# Patient Record
Sex: Male | Born: 1965 | Race: White | Hispanic: No | Smoking: Current every day smoker
Health system: Southern US, Community
[De-identification: ages and names within clinical notes are randomized; demographics above are authoritative.]

## PROBLEM LIST (undated history)

## (undated) DIAGNOSIS — M549 Dorsalgia, unspecified: Secondary | ICD-10-CM

## (undated) DIAGNOSIS — G8929 Other chronic pain: Secondary | ICD-10-CM

---

## 1997-09-19 ENCOUNTER — Ambulatory Visit (HOSPITAL_COMMUNITY): Admission: RE | Admit: 1997-09-19 | Discharge: 1997-09-19 | Payer: Self-pay | Admitting: Family Medicine

## 1999-03-05 ENCOUNTER — Encounter: Admission: RE | Admit: 1999-03-05 | Discharge: 1999-03-21 | Payer: Self-pay | Admitting: *Deleted

## 2005-08-20 ENCOUNTER — Emergency Department (HOSPITAL_COMMUNITY): Admission: EM | Admit: 2005-08-20 | Discharge: 2005-08-20 | Payer: Self-pay | Admitting: Emergency Medicine

## 2007-03-09 ENCOUNTER — Emergency Department (HOSPITAL_COMMUNITY): Admission: EM | Admit: 2007-03-09 | Discharge: 2007-03-09 | Payer: Self-pay | Admitting: Emergency Medicine

## 2007-10-13 ENCOUNTER — Encounter: Admission: RE | Admit: 2007-10-13 | Discharge: 2007-11-04 | Payer: Self-pay | Admitting: Family Medicine

## 2010-12-27 LAB — DIFFERENTIAL
Basophils Absolute: 0
Eosinophils Absolute: 0.2
Eosinophils Relative: 3
Lymphocytes Relative: 24
Monocytes Absolute: 0.5
Neutro Abs: 5

## 2010-12-27 LAB — CBC
HCT: 43.4
MCHC: 34.5
MCV: 87.7
Platelets: 242
WBC: 7.6

## 2010-12-27 LAB — POCT CARDIAC MARKERS
CKMB, poc: <1 — ABNORMAL LOW
Myoglobin, poc: 76
Myoglobin, poc: 85.8
Operator id: 230251
Operator id: 270681
Troponin i, poc: <0.05

## 2010-12-27 LAB — BASIC METABOLIC PANEL
Creatinine, Ser: 0.96
GFR calc Af Amer: >60
Glucose, Bld: 103 — ABNORMAL HIGH
Sodium: 139

## 2011-07-11 ENCOUNTER — Emergency Department (HOSPITAL_COMMUNITY): Payer: Medicare Other

## 2011-07-11 ENCOUNTER — Emergency Department (HOSPITAL_COMMUNITY)
Admission: EM | Admit: 2011-07-11 | Discharge: 2011-07-11 | Disposition: A | Discharge status: Home / Self Care | Payer: Medicare Other | Attending: Emergency Medicine | Admitting: Emergency Medicine

## 2011-07-11 ENCOUNTER — Encounter (HOSPITAL_COMMUNITY): Payer: Self-pay | Admitting: *Deleted

## 2011-07-11 DIAGNOSIS — M25519 Pain in unspecified shoulder: Secondary | ICD-10-CM | POA: Insufficient documentation

## 2011-07-11 DIAGNOSIS — M25512 Pain in left shoulder: Secondary | ICD-10-CM

## 2011-07-11 DIAGNOSIS — Z79899 Other long term (current) drug therapy: Secondary | ICD-10-CM | POA: Insufficient documentation

## 2011-07-11 MED ORDER — TRAMADOL HCL 50 MG PO TABS
50.0000 mg | ORAL_TABLET | Freq: Four times a day (QID) | ORAL | Status: DC
  Administered 2011-07-11: 50 mg via ORAL
  Filled 2011-07-11: qty 1

## 2011-07-11 MED ORDER — IBUPROFEN 800 MG PO TABS
800.0000 mg | ORAL_TABLET | Freq: Once | ORAL | Status: AC
  Administered 2011-07-11: 800 mg via ORAL
  Filled 2011-07-11: qty 1

## 2011-07-11 MED ORDER — TRAMADOL HCL 50 MG PO TABS: 50.0000 mg | ORAL_TABLET | Freq: Four times a day (QID) | ORAL | Status: AC | PRN

## 2011-07-11 NOTE — Discharge Instructions (Signed)
Arthralgia Arthralgia is joint pain. A joint is a place where two bones meet. Joint pain can happen for many reasons. The joint can be bruised, stiff, infected, or weak from aging. Pain usually goes away after resting and taking medicine for soreness.  HOME CARE  Rest the joint as told by your doctor.   Keep the sore joint raised (elevated) for the first 24 hours.   Put ice on the joint area.   Put ice in a plastic bag.   Place a towel between your skin and the bag.   Leave the ice on for 15 to 20 minutes, 3 to 4 times a day.   Wear your splint, casting, elastic bandage, or sling as told by your doctor.   Only take medicine as told by your doctor. Do not take aspirin.   Use crutches as told by your doctor. Do not put weight on the joint until told to by your doctor.  GET HELP RIGHT AWAY IF:   You have bruising, puffiness (swelling), or more pain.   Your fingers or toes turn blue or start to lose feeling (numb).   Your medicine does not lessen the pain.   Your pain becomes severe.   You have a temperature by mouth above 102 F (38.9 C), not controlled by medicine.   You cannot move or use the joint.  MAKE SURE YOU:   Understand these instructions.   Will watch your condition.   Will get help right away if you are not doing well or get worse.  Document Released: 02/26/2009 Document Revised: 02/27/2011 Document Reviewed: 02/26/2009 [facility] Patient Information 2012 [place], [place].Cryotherapy Cryotherapy means treatment with cold. Ice or gel packs can be used to reduce both pain and swelling. Ice is the most helpful within the first 24 to 48 hours after an injury or flareup from overusing a muscle or joint. Sprains, strains, spasms, burning pain, shooting pain, and aches can all be eased with ice. Ice can also be used when recovering from surgery. Ice is effective, has very few side effects, and is safe for most people to use. PRECAUTIONS  Ice is not a safe treatment option  for people with:  Raynaud's phenomenon. This is a condition affecting small blood vessels in the extremities. Exposure to cold may cause your problems to return.   Cold hypersensitivity. There are many forms of cold hypersensitivity, including:   Cold urticaria. Red, itchy hives appear on the skin when the tissues begin to warm after being iced.   Cold erythema. This is a red, itchy rash caused by exposure to cold.   Cold hemoglobinuria. Red blood cells break down when the tissues begin to warm after being iced. The hemoglobin that carry oxygen are passed into the urine because they cannot combine with blood proteins fast enough.   Numbness or altered sensitivity in the area being iced.  If you have any of the following conditions, do not use ice until you have discussed cryotherapy with your caregiver:  Heart conditions, such as arrhythmia, angina, or chronic heart disease.   High blood pressure.   Healing wounds or open skin in the area being iced.   Current infections.   Rheumatoid arthritis.   Poor circulation.   Diabetes.  Ice slows the blood flow in the region it is applied. This is beneficial when trying to stop inflamed tissues from spreading irritating chemicals to surrounding tissues. However, if you expose your skin to cold temperatures for too long or without the  proper protection, you can damage your skin or nerves. Watch for signs of skin damage due to cold. HOME CARE INSTRUCTIONS Follow these tips to use ice and cold packs safely.  Place a dry or damp towel between the ice and skin. A damp towel will cool the skin more quickly, so you may need to shorten the time that the ice is used.   For a more rapid response, add gentle compression to the ice.   Ice for no more than 10 to 20 minutes at a time. The bonier the area you are icing, the less time it will take to get the benefits of ice.   Check your skin after 5 minutes to make sure there are no signs of a poor  response to cold or skin damage.   Rest 20 minutes or more in between uses.   Once your skin is numb, you can end your treatment. You can test numbness by very lightly touching your skin. The touch should be so light that you do not see the skin dimple from the pressure of your fingertip. When using ice, most people will feel these normal sensations in this order: cold, burning, aching, and numbness.   Do not use ice on someone who cannot communicate their responses to pain, such as small children or people with dementia.  HOW TO MAKE AN ICE PACK Ice packs are the most common way to use ice therapy. Other methods include ice massage, ice baths, and cryo-sprays. Muscle creams that cause a cold, tingly feeling do not offer the same benefits that ice offers and should not be used as a substitute unless recommended by your caregiver. To make an ice pack, do one of the following:  Place crushed ice or a bag of frozen vegetables in a sealable plastic bag. Squeeze out the excess air. Place this bag inside another plastic bag. Slide the bag into a pillowcase or place a damp towel between your skin and the bag.   Mix 3 parts water with 1 part rubbing alcohol. Freeze the mixture in a sealable plastic bag. When you remove the mixture from the freezer, it will be slushy. Squeeze out the excess air. Place this bag inside another plastic bag. Slide the bag into a pillowcase or place a damp towel between your skin and the bag.  SEEK MEDICAL CARE IF:  You develop white spots on your skin. This may give the skin a blotchy (mottled) appearance.   Your skin turns blue or pale.   Your skin becomes waxy or hard.   Your swelling gets worse.  MAKE SURE YOU:   Understand these instructions.   Will watch your condition.   Will get help right away if you are not doing well or get worse.  Document Released: 11/04/2010 Document Revised: 02/27/2011 Document Reviewed: 11/04/2010 [facility] Patient Information 2012  [place], [place].   Take the ultram as directed.  Take ibuprofen 600 mg every 8 hrs with food.  Apply ice  Several times daily.  Wear the sling for comfort.  Follow up with either of the referred orthopedist as needed.

## 2011-07-11 NOTE — ED Provider Notes (Signed)
History     CSN: [id number]  Arrival date & time 07/11/11  1246   First MD Initiated Contact with Patient 07/11/11 1434      Chief Complaint  Patient presents with  . Shoulder Injury    (Consider location/radiation/quality/duration/timing/severity/associated sxs/prior treatment) HPI Comments: States he picked his child up and heard "pop" in L shoulder.  Pain with movement.  Patient is a 46 y.o. male presenting with shoulder injury. The history is provided by the patient. No language interpreter was used.  Shoulder Injury This is a new problem. Episode onset: 1 week ago. The problem occurs constantly. The problem has been unchanged. Pertinent negatives include no numbness or weakness. Exacerbated by: movement. He has tried NSAIDs for the symptoms. The treatment provided mild relief.    History reviewed. No pertinent past medical history.  History reviewed. No pertinent past surgical history.  No family history on file.  History  Substance Use Topics  . Smoking status: Current Some Day Smoker  . Smokeless tobacco: Not on file  . Alcohol Use: No      Review of Systems  Musculoskeletal:       Shoulder pain  Neurological: Negative for weakness and numbness.  All other systems reviewed and are negative.    Allergies  Keflex  Home Medications   Current Outpatient Rx  Name Route Sig Dispense Refill  . ALPRAZOLAM 2 MG PO TABS Oral Take 2 mg by mouth 3 (three) times daily.    . ASPIRIN-SALICYLAMIDE-CAFFEINE 650-195-33.3 MG PO PACK Oral Take 1 packet by mouth daily as needed. Pain    . GABAPENTIN 300 MG PO CAPS Oral Take 300 mg by mouth 3 (three) times daily.    . IBUPROFEN 600 MG PO TABS Oral Take 600 mg by mouth every 6 (six) hours as needed. Pain    . NAPROXEN SODIUM 220 MG PO TABS Oral Take 220 mg by mouth 2 (two) times daily as needed. Pain    . SERTRALINE HCL 50 MG PO TABS Oral Take 50 mg by mouth daily.      BP 121/79  Pulse 73  Temp(Src) 97.6 F (36.4 C)  (Oral)  Resp 20  Ht 5\' 11"  (1.803 m)  Wt 127 lb (57.607 kg)  BMI 17.71 kg/m2  SpO2 100%  Physical Exam  Nursing note and vitals reviewed. Constitutional: He is oriented to person, place, and time. He appears well-developed and well-nourished.  HENT:  Head: Normocephalic and atraumatic.  Eyes: EOM are normal.  Neck: Normal range of motion.  Cardiovascular: Normal rate, regular rhythm, normal heart sounds and intact distal pulses.   Pulmonary/Chest: Effort normal and breath sounds normal. No respiratory distress.  Abdominal: Soft. He exhibits no distension. There is no tenderness.  Musculoskeletal:       Left shoulder: He exhibits decreased range of motion, tenderness and pain. He exhibits no bony tenderness, no swelling, no effusion and no deformity.       Arms: Neurological: He is alert and oriented to person, place, and time. Coordination normal.  Skin: Skin is warm and dry.  Psychiatric: He has a normal mood and affect. Judgment normal.    ED Course  Procedures (including critical care time)  Labs Reviewed - No data to display Dg Shoulder Left  07/11/2011  *RADIOLOGY REPORT*  Clinical Data: Left shoulder injury lifting a child, proximal humeral pain  LEFT SHOULDER - 2+ VIEW  Comparison: None  Findings: AC joint alignment normal. Bone mineralization normal. No glenohumeral fracture or dislocation.  Visualized ribs intact.  IMPRESSION: No acute abnormalities.  Original Report Authenticated By: Lollie Marrow, M.D.     1. Left shoulder pain       MDM  rx-ultram, 20 Ibuprofen 600 mg TID Ice F/u with dr. Hilda Lias or Deborha Payment, [place] 07/11/11 [phone]

## 2011-07-11 NOTE — ED Notes (Signed)
Pain rt shoulder since picking up his 46 yo son.  Good radial pulse and distal sensation.

## 2011-07-11 NOTE — ED Notes (Signed)
Pt states that he was picking up his son about a week ago injuring his left shoulder, pt states that pain has been constant since the injury a week ago, cms intact distal.  Pain is increased with movement

## 2011-07-11 NOTE — ED Provider Notes (Signed)
Medical screening examination/treatment/procedure(s) were performed by non-physician practitioner and as supervising physician I was immediately available for consultation/collaboration.   Joya Gaskins, MD 07/11/11 1540

## 2015-08-02 ENCOUNTER — Encounter (HOSPITAL_COMMUNITY): Payer: Self-pay | Admitting: *Deleted

## 2015-08-02 ENCOUNTER — Emergency Department (HOSPITAL_COMMUNITY)
Admission: EM | Admit: 2015-08-02 | Discharge: 2015-08-02 | Disposition: A | Discharge status: Home / Self Care | Payer: Medicare Other | Attending: Emergency Medicine | Admitting: Emergency Medicine

## 2015-08-02 ENCOUNTER — Emergency Department (HOSPITAL_COMMUNITY): Payer: Medicare Other

## 2015-08-02 DIAGNOSIS — Z87891 Personal history of nicotine dependence: Secondary | ICD-10-CM | POA: Diagnosis not present

## 2015-08-02 DIAGNOSIS — Y999 Unspecified external cause status: Secondary | ICD-10-CM | POA: Diagnosis not present

## 2015-08-02 DIAGNOSIS — Y92099 Unspecified place in other non-institutional residence as the place of occurrence of the external cause: Secondary | ICD-10-CM | POA: Insufficient documentation

## 2015-08-02 DIAGNOSIS — Z7982 Long term (current) use of aspirin: Secondary | ICD-10-CM | POA: Diagnosis not present

## 2015-08-02 DIAGNOSIS — Z23 Encounter for immunization: Secondary | ICD-10-CM | POA: Insufficient documentation

## 2015-08-02 DIAGNOSIS — Z79899 Other long term (current) drug therapy: Secondary | ICD-10-CM | POA: Insufficient documentation

## 2015-08-02 DIAGNOSIS — S61531A Puncture wound without foreign body of right wrist, initial encounter: Secondary | ICD-10-CM | POA: Insufficient documentation

## 2015-08-02 DIAGNOSIS — S61551A Open bite of right wrist, initial encounter: Secondary | ICD-10-CM

## 2015-08-02 DIAGNOSIS — Y939 Activity, unspecified: Secondary | ICD-10-CM | POA: Diagnosis not present

## 2015-08-02 DIAGNOSIS — W540XXA Bitten by dog, initial encounter: Secondary | ICD-10-CM | POA: Insufficient documentation

## 2015-08-02 DIAGNOSIS — S60861A Insect bite (nonvenomous) of right wrist, initial encounter: Secondary | ICD-10-CM | POA: Diagnosis present

## 2015-08-02 HISTORY — DX: Other chronic pain: G89.29

## 2015-08-02 HISTORY — DX: Dorsalgia, unspecified: M54.9

## 2015-08-02 MED ORDER — HYDROCODONE-ACETAMINOPHEN 5-325 MG PO TABS: 2.0000 | ORAL_TABLET | ORAL | Status: DC | PRN

## 2015-08-02 MED ORDER — AMOXICILLIN-POT CLAVULANATE 875-125 MG PO TABS: 1.0000 | ORAL_TABLET | Freq: Two times a day (BID) | ORAL | Status: DC

## 2015-08-02 MED ORDER — TETANUS-DIPHTH-ACELL PERTUSSIS 5-2.5-18.5 LF-MCG/0.5 IM SUSP
0.5000 mL | Freq: Once | INTRAMUSCULAR | Status: AC
  Administered 2015-08-02: 0.5 mL via INTRAMUSCULAR
  Filled 2015-08-02: qty 0.5

## 2015-08-02 MED ORDER — HYDROCODONE-ACETAMINOPHEN 5-325 MG PO TABS
2.0000 | ORAL_TABLET | Freq: Once | ORAL | Status: AC
  Administered 2015-08-02: 2 via ORAL
  Filled 2015-08-02: qty 2

## 2015-08-02 NOTE — ED Notes (Signed)
Pt was bit by a neighbor's pit bull on his right wrist

## 2015-08-02 NOTE — Discharge Instructions (Signed)

## 2015-08-02 NOTE — ED Notes (Signed)
Patient verbalizes understanding of discharge instructions, prescriptions, home care and follow up care if needed. Patient ambulatory out of department at this time. 

## 2015-08-02 NOTE — ED Provider Notes (Signed)
CSN: [id number]     Arrival date & time 08/02/15  1444 History   First MD Initiated Contact with Patient 08/02/15 1612     Chief Complaint  Patient presents with  . Animal Bite     (Consider location/radiation/quality/duration/timing/severity/associated sxs/prior Treatment) Patient is a 50 y.o. male presenting with animal bite. The history is provided by the patient. No language interpreter was used.  Animal Bite Contact animal:  Dog Location:  Hand Hand injury location:  R wrist Time since incident:  2 hours Pain details:    Quality:  Aching   Severity:  Moderate   Progression:  Worsening Incident location:  Another residence Provoked: unprovoked   Notifications:  Animal control Animal's rabies vaccination status:  Up to date Animal in possession: yes   Tetanus status:  Unknown Relieved by:  Nothing Worsened by:  Nothing tried Ineffective treatments:  None tried Associated symptoms: swelling   Pt was bitten by neighbors dog.  Past Medical History  Diagnosis Date  . Chronic back pain    History reviewed. No pertinent past surgical history. History reviewed. No pertinent family history. Social History  Substance Use Topics  . Smoking status: Former Games developer  . Smokeless tobacco: None  . Alcohol Use: No    Review of Systems  All other systems reviewed and are negative.     Allergies  Cephalexin  Home Medications   Prior to Admission medications   Medication Sig Start Date End Date Taking? Authorizing Provider  Aspirin-Salicylamide-Caffeine (BC FAST PAIN RELIEF) 650-195-33.3 MG PACK Take 1 packet by mouth daily as needed. Pain   Yes Historical Provider, MD  clonazePAM (KLONOPIN) 1 MG tablet Take 1 mg by mouth 3 (three) times daily as needed. 07/10/15  Yes Historical Provider, MD  gabapentin (NEURONTIN) 300 MG capsule Take 300 mg by mouth 3 (three) times daily.   Yes Historical Provider, MD  amoxicillin-clavulanate (AUGMENTIN) 875-125 MG tablet Take 1 tablet by  mouth every 12 (twelve) hours. 08/02/15   Elson Areas, PA-C  HYDROcodone-acetaminophen (NORCO/VICODIN) 5-325 MG tablet Take 2 tablets by mouth every 4 (four) hours as needed. 08/02/15   Elson Areas, PA-C   BP 129/83 mmHg  Pulse 87  Temp(Src) 99.1 F (37.3 C) (Temporal)  Resp 16  Ht  (1.803 m)  Wt 64.864 kg  BMI 19.95 kg/m2  SpO2 100% Physical Exam  Constitutional: He appears well-developed and well-nourished.  Musculoskeletal: He exhibits tenderness.  Neurological: He is alert.  Skin:  Two puncture wounds dorsal wrist. 1cm laceration palmar aspect of right wrist,  From  nv and ns intact  Psychiatric: He has a normal mood and affect.    ED Course  Procedures (including critical care time) Labs Review Labs Reviewed - No data to display  Imaging Review Dg Wrist Complete Right  08/02/2015  CLINICAL DATA:  Dog bite to the wrist.  Initial encounter. EXAM: RIGHT WRIST - COMPLETE 3+ VIEW COMPARISON:  None. FINDINGS: Soft tissue gas and swelling related to puncture wounds. No opaque foreign body or fracture. IMPRESSION: Soft tissue injury without opaque foreign body or fracture. Electronically Signed   By: Marnee Spring M.D.   On: 08/02/2015 15:13   I have personally reviewed and evaluated these images and lab results as part of my medical decision-making.   EKG Interpretation None      MDM No fracture, wounds cleaned.  Pt cunseled on nimal bite wound care.  Pt given teatnus.    Final diagnoses:  Puncture wound  of right wrist, initial encounter  Animal bite of wrist, right, initial encounter    An After Visit Summary was printed and given to the patient.  Meds ordered this encounter  Medications  . clonazePAM (KLONOPIN) 1 MG tablet    Sig: Take 1 mg by mouth 3 (three) times daily as needed.    Refill:  5  . DISCONTD: HYDROcodone-acetaminophen (NORCO/VICODIN) 5-325 MG tablet    Sig: Take 1 tablet by mouth 3 (three) times daily as needed for moderate pain.      Refill:  0  . Tdap (BOOSTRIX) injection 0.5 mL    Sig:   . amoxicillin-clavulanate (AUGMENTIN) 875-125 MG tablet    Sig: Take 1 tablet by mouth every 12 (twelve) hours.    Dispense:  14 tablet    Refill:  0    Order Specific Question:  Supervising Provider    Answer:  MILLER, BRIAN [[id number]]  . HYDROcodone-acetaminophen (NORCO/VICODIN) 5-325 MG tablet    Sig: Take 2 tablets by mouth every 4 (four) hours as needed.    Dispense:  20 tablet    Refill:  0    Order Specific Question:  Supervising Provider    Answer:  MILLER, BRIAN [[id number]]  . HYDROcodone-acetaminophen (NORCO/VICODIN) 5-325 MG per tablet 2 tablet    Sig:   An After Visit Summary was printed and given to the patient.  Lonia SkinnerLeslie K OlivetSofia, PA-C 08/02/15 1707  Marily MemosJason Mesner, MD 08/02/15 2119

## 2015-08-02 NOTE — ED Notes (Signed)
[facility]

## 2015-08-05 ENCOUNTER — Encounter (HOSPITAL_COMMUNITY): Payer: Self-pay | Admitting: Emergency Medicine

## 2015-08-05 ENCOUNTER — Inpatient Hospital Stay (HOSPITAL_COMMUNITY)
Admission: EM | Admit: 2015-08-05 | Discharge: 2015-08-07 | DRG: 603 | Disposition: A | Discharge status: Home / Self Care | Payer: Medicare Other | Attending: Family Medicine | Admitting: Family Medicine

## 2015-08-05 ENCOUNTER — Emergency Department (HOSPITAL_COMMUNITY): Payer: Medicare Other

## 2015-08-05 DIAGNOSIS — E876 Hypokalemia: Secondary | ICD-10-CM

## 2015-08-05 DIAGNOSIS — E871 Hypo-osmolality and hyponatremia: Secondary | ICD-10-CM

## 2015-08-05 DIAGNOSIS — M7989 Other specified soft tissue disorders: Secondary | ICD-10-CM | POA: Diagnosis not present

## 2015-08-05 DIAGNOSIS — Z888 Allergy status to other drugs, medicaments and biological substances status: Secondary | ICD-10-CM

## 2015-08-05 DIAGNOSIS — Z79899 Other long term (current) drug therapy: Secondary | ICD-10-CM

## 2015-08-05 DIAGNOSIS — L0390 Cellulitis, unspecified: Secondary | ICD-10-CM

## 2015-08-05 DIAGNOSIS — M549 Dorsalgia, unspecified: Secondary | ICD-10-CM | POA: Diagnosis present

## 2015-08-05 DIAGNOSIS — L089 Local infection of the skin and subcutaneous tissue, unspecified: Secondary | ICD-10-CM | POA: Diagnosis present

## 2015-08-05 DIAGNOSIS — W540XXA Bitten by dog, initial encounter: Secondary | ICD-10-CM

## 2015-08-05 DIAGNOSIS — G8929 Other chronic pain: Secondary | ICD-10-CM | POA: Diagnosis present

## 2015-08-05 DIAGNOSIS — Z87891 Personal history of nicotine dependence: Secondary | ICD-10-CM

## 2015-08-05 DIAGNOSIS — S61551A Open bite of right wrist, initial encounter: Secondary | ICD-10-CM | POA: Diagnosis present

## 2015-08-05 DIAGNOSIS — L03113 Cellulitis of right upper limb: Principal | ICD-10-CM | POA: Diagnosis present

## 2015-08-05 DIAGNOSIS — Y929 Unspecified place or not applicable: Secondary | ICD-10-CM

## 2015-08-05 LAB — CBC WITH DIFFERENTIAL/PLATELET
BASOS PCT: 0 %
Basophils Absolute: 0 10*3/uL (ref 0.0–0.1)
EOS ABS: 0.1 10*3/uL (ref 0.0–0.7)
EOS PCT: 1 %
HCT: 42.2 % (ref 39.0–52.0)
HEMOGLOBIN: 13.4 g/dL (ref 13.0–17.0)
LYMPHS ABS: 1.3 10*3/uL (ref 0.7–4.0)
Lymphocytes Relative: 14 %
MCH: 28.2 pg (ref 26.0–34.0)
MCHC: 31.8 g/dL (ref 30.0–36.0)
MCV: 88.7 fL (ref 78.0–100.0)
MONOS PCT: 9 %
Monocytes Absolute: 0.8 10*3/uL (ref 0.1–1.0)
NEUTROS PCT: 76 %
Neutro Abs: 6.9 10*3/uL (ref 1.7–7.7)
PLATELETS: 335 10*3/uL (ref 150–400)
RBC: 4.76 MIL/uL (ref 4.22–5.81)
RDW: 12.6 % (ref 11.5–15.5)
WBC: 9.1 10*3/uL (ref 4.0–10.5)

## 2015-08-05 LAB — BASIC METABOLIC PANEL
Anion gap: 6 (ref 5–15)
BUN: 11 mg/dL (ref 6–20)
CALCIUM: 9.1 mg/dL (ref 8.9–10.3)
CO2: 30 mmol/L (ref 22–32)
CREATININE: 0.82 mg/dL (ref 0.61–1.24)
Chloride: 98 mmol/L — ABNORMAL LOW (ref 101–111)
Glucose, Bld: 98 mg/dL (ref 65–99)
Potassium: 3 mmol/L — ABNORMAL LOW (ref 3.5–5.1)
SODIUM: 134 mmol/L — AB (ref 135–145)

## 2015-08-05 LAB — SEDIMENTATION RATE: Sed Rate: 34 mm/hr — ABNORMAL HIGH (ref 0–16)

## 2015-08-05 MED ORDER — POTASSIUM CHLORIDE CRYS ER 20 MEQ PO TBCR
40.0000 meq | EXTENDED_RELEASE_TABLET | Freq: Once | ORAL | Status: AC
  Administered 2015-08-05: 40 meq via ORAL
  Filled 2015-08-05: qty 2

## 2015-08-05 MED ORDER — SODIUM CHLORIDE 0.9 % IV SOLN
INTRAVENOUS | Status: AC
  Administered 2015-08-05: 3 g
  Filled 2015-08-05: qty 3

## 2015-08-05 MED ORDER — ENOXAPARIN SODIUM 40 MG/0.4ML ~~LOC~~ SOLN
40.0000 mg | SUBCUTANEOUS | Status: DC
  Administered 2015-08-06: 40 mg via SUBCUTANEOUS
  Filled 2015-08-05: qty 0.4

## 2015-08-05 MED ORDER — VANCOMYCIN HCL 10 G IV SOLR
1500.0000 mg | Freq: Once | INTRAVENOUS | Status: AC
  Administered 2015-08-05: 1500 mg via INTRAVENOUS
  Filled 2015-08-05: qty 1500

## 2015-08-05 MED ORDER — PIPERACILLIN-TAZOBACTAM 3.375 G IVPB
3.3750 g | Freq: Three times a day (TID) | INTRAVENOUS | Status: DC
  Administered 2015-08-05 – 2015-08-07 (×6): 3.375 g via INTRAVENOUS
  Filled 2015-08-05 (×5): qty 50

## 2015-08-05 MED ORDER — HYDROCODONE-ACETAMINOPHEN 5-325 MG PO TABS
2.0000 | ORAL_TABLET | ORAL | Status: DC | PRN
  Administered 2015-08-05 – 2015-08-06 (×5): 2 via ORAL
  Filled 2015-08-05 (×6): qty 2

## 2015-08-05 MED ORDER — VANCOMYCIN HCL 10 G IV SOLR
1250.0000 mg | Freq: Two times a day (BID) | INTRAVENOUS | Status: DC
  Administered 2015-08-06 – 2015-08-07 (×3): 1250 mg via INTRAVENOUS
  Filled 2015-08-05 (×5): qty 1250

## 2015-08-05 MED ORDER — GABAPENTIN 300 MG PO CAPS
300.0000 mg | ORAL_CAPSULE | Freq: Three times a day (TID) | ORAL | Status: DC
  Administered 2015-08-05 – 2015-08-07 (×6): 300 mg via ORAL
  Filled 2015-08-05 (×6): qty 1

## 2015-08-05 MED ORDER — MORPHINE SULFATE (PF) 4 MG/ML IV SOLN
4.0000 mg | Freq: Once | INTRAVENOUS | Status: AC
  Administered 2015-08-05: 4 mg via INTRAVENOUS
  Filled 2015-08-05: qty 1

## 2015-08-05 MED ORDER — ONDANSETRON HCL 4 MG/2ML IJ SOLN
4.0000 mg | Freq: Once | INTRAMUSCULAR | Status: AC
  Administered 2015-08-05: 4 mg via INTRAVENOUS
  Filled 2015-08-05: qty 2

## 2015-08-05 MED ORDER — CLONAZEPAM 0.5 MG PO TABS
1.0000 mg | ORAL_TABLET | Freq: Three times a day (TID) | ORAL | Status: DC | PRN
  Administered 2015-08-05 – 2015-08-07 (×4): 1 mg via ORAL
  Filled 2015-08-05 (×4): qty 2

## 2015-08-05 NOTE — ED Notes (Signed)
Seen here on 5/11 for dog bite to right wrist. Compliant with Augment. Worsened now with increase in swelling, redness with streaking toward elbow, pain and yellow pus drainage at puncture sites. No fevers.

## 2015-08-05 NOTE — H&P (Signed)
TRH H&P   Patient Demographics:    Dennis Wells, is a 50 y.o. male  MRN: [id number]   DOB - 05/27/1965  Admit Date - 08/05/2015  Outpatient Primary MD for the patient is Ricke Hey, MD  Referring MD/NP/PA:   Julianne Rice  Outpatient Specialists: none  Patient coming from: home  Chief Complaint  Patient presents with  . Wound Check      HPI:    Dennis Wells  is a 50 y.o. male, w hx of dog bite on right wrist.  Animal control took the dog and neighbors said that it had all its shots but couldn't find the paperwork.  Pt came to ED and was treated with Augmentin,  And given Td.  Pt presented today due to fact that redness seemed to be increasing and having slight right forearm swelling.     Review of systems:    In addition to the HPI above,  No Fever-chills, No Headache, No changes with Vision or hearing, No problems swallowing food or Liquids, No Chest pain, Cough or Shortness of Breath, No Abdominal pain, No Nausea or Vommitting, Bowel movements are regular, No Blood in stool or Urine, No dysuria, No  bruises, No new joints pains-aches,  No new weakness, tingling, numbness in any extremity, No recent weight gain or loss, No polyuria, polydypsia or polyphagia, No significant Mental Stressors.  A full 10 point Review of Systems was done, except as stated above, all other Review of Systems were negative.   With Past History of the following :    Past Medical History  Diagnosis Date  . Chronic back pain       History reviewed. No pertinent past surgical history.    Social History:     Social History  Substance Use Topics  . Smoking status: Former Smoker -- 1.00 packs/day for 30 years    Types: Cigarettes    Quit date: 03/23/2013  . Smokeless tobacco: Not on file  . Alcohol Use: No     Lives - at home  Mobility - able to  ambulated    Family History :     Family History  Problem Relation Age of Onset  . Heart attack Father 91  . Stroke Mother       Home Medications:   Prior to Admission medications   Medication Sig Start Date End Date Taking? Authorizing Provider  amoxicillin-clavulanate (AUGMENTIN) 875-125 MG tablet Take 1 tablet by mouth every 12 (twelve) hours. 08/02/15  Yes Hollace Kinnier Sofia, PA-C  clonazePAM (KLONOPIN) 1 MG tablet Take 1 mg by mouth 3 (three) times daily as needed for anxiety.  07/10/15  Yes Historical Provider, MD  gabapentin (NEURONTIN) 300 MG capsule Take 300 mg by mouth 3 (three) times daily.   Yes Historical Provider, MD  HYDROcodone-acetaminophen (NORCO/VICODIN) 5-325 MG tablet Take 2 tablets by mouth every 4 (four) hours  as needed. Patient taking differently: Take 2 tablets by mouth every 4 (four) hours as needed for moderate pain.  08/02/15  Yes Hollace Kinnier Sofia, PA-C  Aspirin-Salicylamide-Caffeine (BC FAST PAIN RELIEF) [phone] MG PACK Take 1 packet by mouth daily as needed. Pain    Historical Provider, MD     Allergies:     Allergies  Allergen Reactions  . Cephalexin Itching     Physical Exam:   Vitals  Blood pressure 124/69, pulse 107, temperature 98.4 F (36.9 C), temperature source Oral, resp. rate 18, SpO2 99 %.   1. General  White male lying in bed in NAD,    2. Normal affect and insight, Not Suicidal or Homicidal, Awake Alert, Oriented X 3.  3. No F.N deficits, ALL C.Nerves Intact, Strength 5/5 all 4 extremities, Sensation intact all 4 extremities, Plantars down going.  4. Ears and Eyes appear Normal, Conjunctivae clear, PERRLA. Moist Oral Mucosa.  5. Supple Neck, No JVD, No cervical lymphadenopathy appriciated, No Carotid Bruits.  6. Symmetrical Chest wall movement, Good air movement bilaterally, CTAB.  7. RRR, No Gallops, Rubs or Murmurs, No Parasternal Heave.  8. Positive Bowel Sounds, Abdomen Soft, No tenderness, No organomegaly  appriciated,No rebound -guarding or rigidity.  9.  No Cyanosis, Normal Skin Turgor, + evidence of dog bite on the lateral aspect of right wrist and redness extending about 1/2 up to elbow. Lateral aspect of forearm. Slight warmth.   10. Good muscle tone,  joints appear normal , no effusions, Normal ROM.  11. No Palpable Lymph Nodes in Neck or Axillae     Data Review:    CBC  Recent Labs Lab 08/05/15 1521  WBC 9.1  HGB 13.4  HCT 42.2  PLT 335  MCV 88.7  MCH 28.2  MCHC 31.8  RDW 12.6  LYMPHSABS 1.3  MONOABS 0.8  EOSABS 0.1  BASOSABS 0.0   ------------------------------------------------------------------------------------------------------------------  Chemistries   Recent Labs Lab 08/05/15 1521  NA 134*  K 3.0*  CL 98*  CO2 30  GLUCOSE 98  BUN 11  CREATININE 0.82  CALCIUM 9.1   ------------------------------------------------------------------------------------------------------------------ estimated creatinine clearance is 100 mL/min (by C-G formula based on Cr of 0.82). ------------------------------------------------------------------------------------------------------------------ No results for input(s): TSH, T4TOTAL, T3FREE, THYROIDAB in the last 72 hours.  Invalid input(s): FREET3  Coagulation profile No results for input(s): INR, PROTIME in the last 168 hours. ------------------------------------------------------------------------------------------------------------------- No results for input(s): DDIMER in the last 72 hours. -------------------------------------------------------------------------------------------------------------------  Cardiac Enzymes No results for input(s): CKMB, TROPONINI, MYOGLOBIN in the last 168 hours.  Invalid input(s): CK ------------------------------------------------------------------------------------------------------------------ No results found for:  BNP   ---------------------------------------------------------------------------------------------------------------  Urinalysis No results found for: COLORURINE, APPEARANCEUR, LABSPEC, PHURINE, GLUCOSEU, HGBUR, BILIRUBINUR, KETONESUR, PROTEINUR, UROBILINOGEN, NITRITE, LEUKOCYTESUR  ----------------------------------------------------------------------------------------------------------------   Imaging Results:    Dg Wrist Complete Right  08/05/2015  CLINICAL DATA:  Right wrist pain, dog bite 08/02/2015, redness and swelling EXAM: RIGHT WRIST - COMPLETE 3+ VIEW COMPARISON:  08/02/2015 FINDINGS: Four views of the right wrist submitted. No acute fracture or subluxation. Mild soft tissue swelling ulnar aspect of the wrist. No radiopaque foreign body. IMPRESSION: No acute fracture or subluxation. Mild soft tissue swelling ulnar aspect of the wrist. Electronically Signed   By: Lahoma Crocker M.D.   On: 08/05/2015 15:35      Assessment & Plan:    Active Problems:   Cellulitis   Hypokalemia   Hyponatremia    1. Cellulitis refractory to outpatient augmentin Blood culture x2,  Check esr Start on vanco  and zosyn iv pharmacy to dose.  Orthopedics (Dr. Aline Brochure) consulted by ED , will be by in am to evaluate per ED.  Appreciate his input.   2.  Hypokalemia Replete Check cmp in am  3. Hyponatremia Hydrate with ns iv Check cmp in am  DVT Prophylaxis Lovenox  AM Labs Ordered, also please review Full Orders  Family Communication: Admission, patients condition and plan of care including tests being ordered have been discussed with the who indicate understanding and agree with the plan and Code Status.  Code Status FULL CODE  Likely DC to  home  Condition GUARDED    Consults called: orthopedics Dr. Aline Brochure called by ED  Admission status: obs  Time spent in minutes : 40 minutes   Jani Gravel M.D on 08/05/2015 at 5:39 PM  Between 7am to 7pm - Pager - [phone]. After 7pm go  to [email or web address] - password [facility]  [facility] - Office  [phone]

## 2015-08-05 NOTE — ED Provider Notes (Signed)
CSN: [id number]     Arrival date & time 08/05/15  1354 History  By signing my name below, I, Tanda RockersMargaux Venter, attest that this documentation has been prepared under the direction and in the presence of Ivery QualeHobson Edson Deridder, PA-C.  Electronically Signed: Tanda RockersMargaux Venter, ED Scribe. 08/05/2015. 2:42 PM.   Chief Complaint  Patient presents with  . Wound Check   The history is provided by the patient. No language interpreter was used.    HPI Comments: Dennis Wells is a 50 y.o. male who presents to the Emergency Department complaining of gradual onset, constant, worsening, pain and swelling to right wrist s/p animal bite that occurred 3 days ago. Pt was seen in the ED 3 days ago for the dog bite and was treated with Augmentin. He reports that since being seen he has continued to have pain but reports the pain and swelling is now radiating to right forearm and right elbow. He also complains of redness to the right forearm. Wife mentions that the wound has been draining green purulent discharge as well. Denies fever, chills, nausea, vomiting, or any other associated symptoms. No hx DM.   Per chart review: Pt was seen in the ED on 08/02/2015 (3 days ago) for a dog bite to right wrist. Pt had an x ray done at that time with findings of: soft tissue injury without opaque foreign body or fracture. His tetanus was update and wounds were cleaned in the ED. Pt was discharged home with prescriptions for Augmentin and Norco.   Past Medical History  Diagnosis Date  . Chronic back pain    History reviewed. No pertinent past surgical history. History reviewed. No pertinent family history. Social History  Substance Use Topics  . Smoking status: Former Games developermoker  . Smokeless tobacco: None  . Alcohol Use: No    Review of Systems  Constitutional: Negative for fever and chills.  Musculoskeletal: Positive for joint swelling and arthralgias (right wrist).  Skin: Positive for color change.  All other systems reviewed  and are negative.  Allergies  Cephalexin  Home Medications   Prior to Admission medications   Medication Sig Start Date End Date Taking? Authorizing Provider  amoxicillin-clavulanate (AUGMENTIN) 875-125 MG tablet Take 1 tablet by mouth every 12 (twelve) hours. 08/02/15  Yes Lonia SkinnerLeslie K Sofia, PA-C  clonazePAM (KLONOPIN) 1 MG tablet Take 1 mg by mouth 3 (three) times daily as needed for anxiety.  07/10/15  Yes Historical Provider, MD  gabapentin (NEURONTIN) 300 MG capsule Take 300 mg by mouth 3 (three) times daily.   Yes Historical Provider, MD  HYDROcodone-acetaminophen (NORCO/VICODIN) 5-325 MG tablet Take 2 tablets by mouth every 4 (four) hours as needed. Patient taking differently: Take 2 tablets by mouth every 4 (four) hours as needed for moderate pain.  08/02/15  Yes Lonia SkinnerLeslie K Sofia, PA-C  Aspirin-Salicylamide-Caffeine (BC FAST PAIN RELIEF) 873-533-7261650-195-33.3 MG PACK Take 1 packet by mouth daily as needed. Pain    Historical Provider, MD   BP 124/69 mmHg  Pulse 107  Temp(Src) 98.4 F (36.9 C) (Oral)  Resp 18  SpO2 99%   Physical Exam  Constitutional: He is oriented to person, place, and time. He appears well-developed and well-nourished. No distress.  HENT:  Head: Normocephalic and atraumatic.  Eyes: Conjunctivae and EOM are normal.  Neck: Neck supple. No tracheal deviation present.  Cardiovascular: Regular rhythm.  Tachycardia present.   Pulmonary/Chest: Effort normal. No respiratory distress.  Musculoskeletal: Normal range of motion.  There is no palpable lymph  nodes of the bicep tricep.  Full ROM of right shoulder and right elbow. No effusion of right elbow joint.  There is increased redness and some warmth of the right forearm.  Full ROM of the fingers on right hand.  Capillary refill is less than 2 seconds.  There is some increased redness of the palmar surface of the right forearm. There are 2 lacerations/puncture areas that are healing over the wrist area on the ulnar aspect. No  active drainage at this time.  There is no effusion or swelling of the thenar eminence.   Neurological: He is alert and oriented to person, place, and time.  Skin: Skin is warm and dry.  Psychiatric: He has a normal mood and affect. His behavior is normal.  Nursing note and vitals reviewed.   ED Course  Pt seen with me by Dr Ranae Palms. Pt to be started on Unasyn. Hand specialist to be paged. Dr. Janee Morn was consulted, but deferred to the orthopedist on call at this facility unless there was a tertiary hand surgery emergency.  Case discussed with Dr. Romeo Apple. He requests the patient be admitted by the hospitalist, and he will provide with PE consultation.  Case discussed with Dr. Bertis Ruddy. He came to the emergency department, patient will be admitted to the hospital.    Procedures (including critical care time)  DIAGNOSTIC STUDIES: Oxygen Saturation is 100% on RA, normal by my interpretation.    COORDINATION OF CARE: 2:41 PM-Discussed treatment plan which includes blood work and DG R Wrist with pt at bedside and pt agreed to plan.   Labs Review Labs Reviewed - No data to display  Imaging Review No results found. I have personally reviewed and evaluated these images and lab results as part of my medical decision-making.   EKG Interpretation None      MDM  Vital signs reviewed. X-rays reviewed, and compared to the x-rays of May 11. Labs reviewed. The patient is started on Unasyn here in the emergency department. The patient is to be admitted to the hospitalist service for additional antibiotics and orthopedic evaluation. I've discussed all the findings with the patient in terms which he understands, the patient is in agreement with this discharge plan.    Final diagnoses:  Infected dog bite    **I have reviewed nursing notes, vital signs, and all appropriate lab and imaging results for this patient.     Ivery Quale, PA-C 08/05/15 1749  Loren Racer,  MD 08/06/15 [phone]

## 2015-08-05 NOTE — Progress Notes (Signed)
Pharmacy Antibiotic Note  Dennis Wells is a 50 y.o. male admitted on 08/05/2015 with cellulitis.  Pharmacy has been consulted for Vancomycin dosing.  Plan: Vancomycin 1500mg  loading dose then 1250mg   IV every 12 hours.  Goal trough 10-15 mcg/mL.  F/U Blood cultures, V/S and tx  Height: 5\' 11"  (180.3 cm) Weight: 149 lb 4.8 oz (67.722 kg) IBW/kg (Calculated) : 75.3  Temp (24hrs), Avg:98.5 F (36.9 C), Min:98.4 F (36.9 C), Max:98.5 F (36.9 C)   Recent Labs Lab 08/05/15 1521  WBC 9.1  CREATININE 0.82    Estimated Creatinine Clearance: 104.3 mL/min (by C-G formula based on Cr of 0.82).    Allergies  Allergen Reactions  . Cephalexin Itching    Antimicrobials this admission: Vancomycin 5/14 >>  Zosyn 5/14 >>   Dose adjustments this admission:   Microbiology results: 5/14 BCx: pending  Thank you for allowing pharmacy to be a part of this patient's care.  Dennis CyphersLorie Aneliese Wells, BS Dennis Wells, [place] Clinical Pharmacist Pager [phone] 08/05/2015 6:45 PM

## 2015-08-05 NOTE — ED Notes (Signed)
Paged Dr.Harrison at 17:11 to [phone]

## 2015-08-06 DIAGNOSIS — W540XXA Bitten by dog, initial encounter: Secondary | ICD-10-CM | POA: Diagnosis not present

## 2015-08-06 DIAGNOSIS — M549 Dorsalgia, unspecified: Secondary | ICD-10-CM | POA: Diagnosis present

## 2015-08-06 DIAGNOSIS — Z79899 Other long term (current) drug therapy: Secondary | ICD-10-CM | POA: Diagnosis not present

## 2015-08-06 DIAGNOSIS — S61551A Open bite of right wrist, initial encounter: Secondary | ICD-10-CM | POA: Diagnosis present

## 2015-08-06 DIAGNOSIS — G8929 Other chronic pain: Secondary | ICD-10-CM | POA: Diagnosis present

## 2015-08-06 DIAGNOSIS — Z87891 Personal history of nicotine dependence: Secondary | ICD-10-CM | POA: Diagnosis not present

## 2015-08-06 DIAGNOSIS — E876 Hypokalemia: Secondary | ICD-10-CM | POA: Diagnosis present

## 2015-08-06 DIAGNOSIS — E871 Hypo-osmolality and hyponatremia: Secondary | ICD-10-CM | POA: Diagnosis present

## 2015-08-06 DIAGNOSIS — Z888 Allergy status to other drugs, medicaments and biological substances status: Secondary | ICD-10-CM | POA: Diagnosis not present

## 2015-08-06 DIAGNOSIS — L03113 Cellulitis of right upper limb: Secondary | ICD-10-CM | POA: Diagnosis not present

## 2015-08-06 DIAGNOSIS — Y929 Unspecified place or not applicable: Secondary | ICD-10-CM | POA: Diagnosis not present

## 2015-08-06 DIAGNOSIS — L089 Local infection of the skin and subcutaneous tissue, unspecified: Secondary | ICD-10-CM

## 2015-08-06 DIAGNOSIS — M7989 Other specified soft tissue disorders: Secondary | ICD-10-CM | POA: Diagnosis present

## 2015-08-06 LAB — CBC WITH DIFFERENTIAL/PLATELET
BASOS ABS: 0 10*3/uL (ref 0.0–0.1)
BASOS PCT: 0 %
Eosinophils Absolute: 0.4 10*3/uL (ref 0.0–0.7)
Eosinophils Relative: 6 %
HEMATOCRIT: 38 % — AB (ref 39.0–52.0)
HEMOGLOBIN: 12.3 g/dL — AB (ref 13.0–17.0)
LYMPHS PCT: 31 %
Lymphs Abs: 2.2 10*3/uL (ref 0.7–4.0)
MCH: 29 pg (ref 26.0–34.0)
MCHC: 32.4 g/dL (ref 30.0–36.0)
MCV: 89.6 fL (ref 78.0–100.0)
MONOS PCT: 12 %
Monocytes Absolute: 0.9 10*3/uL (ref 0.1–1.0)
NEUTROS ABS: 3.6 10*3/uL (ref 1.7–7.7)
NEUTROS PCT: 51 %
Platelets: 328 10*3/uL (ref 150–400)
RBC: 4.24 MIL/uL (ref 4.22–5.81)
RDW: 12.6 % (ref 11.5–15.5)
WBC: 7 10*3/uL (ref 4.0–10.5)

## 2015-08-06 MED ORDER — ACETAMINOPHEN 325 MG PO TABS: 650.0000 mg | ORAL_TABLET | Freq: Four times a day (QID) | ORAL | Status: DC | PRN

## 2015-08-06 MED ORDER — HYDROCODONE-ACETAMINOPHEN 5-325 MG PO TABS
2.0000 | ORAL_TABLET | ORAL | Status: DC | PRN
  Administered 2015-08-06 – 2015-08-07 (×5): 2 via ORAL
  Filled 2015-08-06 (×4): qty 2

## 2015-08-06 NOTE — Care Management Note (Signed)
Case Management Note  Patient Details  Name: Dennis FlashJohnathan A Wells MRN: [id number] Date of Birth: 1965-06-30  Subjective/Objective:Spoke with patient who is alert and oriented from home. Stated that they can afford medications and are able to make thier medical appointments without difficulty. No DME or O2 at home. No CM needs identified.                    Action/Plan: Home with self care.   Expected Discharge Date:                  Expected Discharge Plan:  Home/Self Care  In-House Referral:     Discharge planning Services  CM Consult  Post Acute Care Choice:    Choice offered to:     DME Arranged:    DME Agency:     HH Arranged:    HH Agency:     Status of Service:  Completed, signed off  Medicare Important Message Given:    Date Medicare IM Given:    Medicare IM give by:    Date Additional Medicare IM Given:    Additional Medicare Important Message give by:     If discussed at Long Length of Stay Meetings, dates discussed:    Additional Comments:  Adonis HugueninBerkhead, Azarie Coriz L, RN 08/06/2015, 11:30 AM

## 2015-08-06 NOTE — Progress Notes (Signed)
PROGRESS NOTE  Dennis Wells YNW:[id number] DOB: 1966-01-28 DOA: 08/05/2015 PCP: Billee CashingMCKENZIE, WAYLAND, MD  Brief Narrative: [age] yom sustained dog bite on right wrist recently, treated with Augmentin, given tetanus injection and discharged from the emergency department presented with worsening swelling and pain despite Augmentin.  Assessment/Plan: 1. Cellulitis, secondary to dog bite. Started on outpatient Augmentin with no improvement. Currently rapidly improving on IV antibiotics. No evidence of complicating features. Hand uninvolved. Ortho evaluated and does not believe surgical intervention is needed at this time.    Appears to be improving rapidly. Continue empiric antibiotics IV. Appreciate orthopedic recommendations. May be in the home next 24 hours.  DVT prophylaxis: Lovenox Code Status: Full Family Communication: Wife bedside  Disposition Plan: home 24 hours  Brendia Sacksaniel Afra Tricarico, MD  [facility] Direct contact:  --Via amion app OR  --[email or web address]; password [email or web address] and click  7PM-7AM contact night coverage as above 08/06/2015, 4:47 PM  LOS: 0 days   Consultants:  Orthopedics  Procedures:  None  Antimicrobials:  Vancomycin 5/15>>  Zosyn 5/14>>  HPI/Subjective: Wrist is doing better. The swelling is going down. No problems with this hand.  Objective: Filed Vitals:   08/05/15 2215 08/06/15 0400 08/06/15 0458 08/06/15 1319  BP: 119/77 99/61  99/62  Pulse: 80 61  64  Temp: 97.9 F (36.6 C) 97.6 F (36.4 C)  97.5 F (36.4 C)  TempSrc: Oral Oral  Oral  Resp: 18 16  20   Height:      Weight:   68.584 kg (151 lb 3.2 oz)   SpO2: 99% 98%  97%    Intake/Output Summary (Last 24 hours) at 08/06/15 1647 Last data filed at 08/06/15 0920  Gross per 24 hour  Intake    360 ml  Output    550 ml  Net   -190 ml     Filed Weights   08/05/15 1824 08/06/15 0458  Weight: 67.722 kg (149 lb 4.8 oz) 68.584 kg (151 lb 3.2 oz)    Exam:  Constitutional:  . Appears  calm and comfortable Respiratory:  . CTA bilaterally, no w/r/r.  . Respiratory effort normal. No retractions or accessory muscle use Cardiovascular:  . RRR, no m/r/g . No LE extremity edema   Skin:  . Mild to moderate swelling of the right wrist with some erythema, no fluctuance. There is some healing abrasions over the radial and ulnar portions of the wrist, there is no drainage. The hand is well perfused and appears uninvolved Psychiatric:  . judgement and insight appear normal . Mental status o Mood, affect appropriate  I have personally reviewed following labs and imaging studies:  WBC 7.0  Hgb 12.3  Scheduled Meds: . enoxaparin (LOVENOX) injection  40 mg Subcutaneous Q24H  . gabapentin  300 mg Oral TID  . piperacillin-tazobactam (ZOSYN)  IV  3.375 g Intravenous Q8H  . vancomycin  1,250 mg Intravenous Q12H   Continuous Infusions:   Principal Problem:   Cellulitis Active Problems:   Infected dog bite   LOS: 0 days   Time spent: 15 minutes   By signing my name below, I, Zadie CleverlyJessica Augustus attest that this documentation has been prepared under the direction and in the presence of Brendia Sacksaniel Reiley Keisler, MD Electronically signed: Zadie CleverlyJessica Augustus 08/06/2015 8:19am  I personally performed the services described in this documentation. All medical record entries made by the scribe were at my direction. I have reviewed the chart and agree that the record reflects my personal performance and is accurate and complete. Reuel Boomaniel  Irene Limbo, MD

## 2015-08-06 NOTE — Care Management Obs Status (Signed)
MEDICARE OBSERVATION STATUS NOTIFICATION   Patient Details  Name: Dennis Wells MRN: [id number] Date of Birth: [birth year]   Medicare Observation Status Notification Given:  Yes    Adonis HugueninBerkhead, Theresia Pree L, RN 08/06/2015, 10:32 AM

## 2015-08-06 NOTE — Consult Note (Signed)
Reason for Consult: Right wrist multiple dog bites  Referring Physician: Dr. [name] is an 50 y.o. male.  HPI: 50 year old male multiple dog bites came to the ER on May 12 had some oral antibiotics x-rays were negative. He came back on the 14th secondary to increasing pain and swelling repeat x-ray show no gas no fracture just soft tissue swelling. He had been on Augmentin and it was not improving his situation so he was admitted for IV antibiotics  He initially complained of Right wrist pain and swelling Dull ache Initially severe improved on antibiotics IV Duration is 3 days now Timing pain is constant but decreasing Modifying factors Unasyn and vancomycin  Past Medical History  Diagnosis Date  . Chronic back pain     The patient reported that he has never had surgery  Family History  Problem Relation Age of Onset  . Heart attack Father 53  . Stroke Mother     Social History:  reports that he quit smoking about 2 years ago. His smoking use included Cigarettes. He has a 30 pack-year smoking history. He does not have any smokeless tobacco history on file. He reports that he does not drink alcohol or use illicit drugs.  Allergies:  Allergies  Allergen Reactions  . Cephalexin Itching    Medications: I have reviewed the patient's current medications.  Results for orders placed or performed during the hospital encounter of 08/05/15 (from the past 48 hour(s))  CBC with Differential     Status: None   Collection Time: 08/05/15  3:21 PM  Result Value Ref Range   WBC 9.1 4.0 - 10.5 K/uL   RBC 4.76 4.22 - 5.81 MIL/uL   Hemoglobin 13.4 13.0 - 17.0 g/dL   HCT 42.2 39.0 - 52.0 %   MCV 88.7 78.0 - 100.0 fL   MCH 28.2 26.0 - 34.0 pg   MCHC 31.8 30.0 - 36.0 g/dL   RDW 12.6 11.5 - 15.5 %   Platelets 335 150 - 400 K/uL   Neutrophils Relative % 76 %   Neutro Abs 6.9 1.7 - 7.7 K/uL   Lymphocytes Relative 14 %   Lymphs Abs 1.3 0.7 - 4.0 K/uL   Monocytes Relative  9 %   Monocytes Absolute 0.8 0.1 - 1.0 K/uL   Eosinophils Relative 1 %   Eosinophils Absolute 0.1 0.0 - 0.7 K/uL   Basophils Relative 0 %   Basophils Absolute 0.0 0.0 - 0.1 K/uL  Basic metabolic panel     Status: Abnormal   Collection Time: 08/05/15  3:21 PM  Result Value Ref Range   Sodium 134 (L) 135 - 145 mmol/L   Potassium 3.0 (L) 3.5 - 5.1 mmol/L   Chloride 98 (L) 101 - 111 mmol/L   CO2 30 22 - 32 mmol/L   Glucose, Bld 98 65 - 99 mg/dL   BUN 11 6 - 20 mg/dL   Creatinine, Ser 0.82 0.61 - 1.24 mg/dL   Calcium 9.1 8.9 - 10.3 mg/dL   GFR calc non Af Amer >60 >60 mL/min   GFR calc Af Amer >60 >60 mL/min    Comment: (NOTE) The eGFR has been calculated using the CKD EPI equation. This calculation has not been validated in all clinical situations. eGFR's persistently <60 mL/min signify possible Chronic Kidney Disease.    Anion gap 6 5 - 15  Culture, blood (routine x 2)     Status: None (Preliminary result)   Collection Time: 08/05/15  8:12  PM  Result Value Ref Range   Specimen Description BLOOD RIGHT HAND    Special Requests BOTTLES DRAWN AEROBIC ONLY 4CC    Culture PENDING    Report Status PENDING   Sedimentation rate     Status: Abnormal   Collection Time: 08/05/15  8:12 PM  Result Value Ref Range   Sed Rate 34 (H) 0 - 16 mm/hr  CBC WITH DIFFERENTIAL     Status: Abnormal   Collection Time: 08/06/15  4:50 AM  Result Value Ref Range   WBC 7.0 4.0 - 10.5 K/uL   RBC 4.24 4.22 - 5.81 MIL/uL   Hemoglobin 12.3 (L) 13.0 - 17.0 g/dL   HCT 38.0 (L) 39.0 - 52.0 %   MCV 89.6 78.0 - 100.0 fL   MCH 29.0 26.0 - 34.0 pg   MCHC 32.4 30.0 - 36.0 g/dL   RDW 12.6 11.5 - 15.5 %   Platelets 328 150 - 400 K/uL   Neutrophils Relative % 51 %   Neutro Abs 3.6 1.7 - 7.7 K/uL   Lymphocytes Relative 31 %   Lymphs Abs 2.2 0.7 - 4.0 K/uL   Monocytes Relative 12 %   Monocytes Absolute 0.9 0.1 - 1.0 K/uL   Eosinophils Relative 6 %   Eosinophils Absolute 0.4 0.0 - 0.7 K/uL   Basophils Relative  0 %   Basophils Absolute 0.0 0.0 - 0.1 K/uL    Dg Wrist Complete Right  08/05/2015  CLINICAL DATA:  Right wrist pain, dog bite 08/02/2015, redness and swelling EXAM: RIGHT WRIST - COMPLETE 3+ VIEW COMPARISON:  08/02/2015 FINDINGS: Four views of the right wrist submitted. No acute fracture or subluxation. Mild soft tissue swelling ulnar aspect of the wrist. No radiopaque foreign body. IMPRESSION: No acute fracture or subluxation. Mild soft tissue swelling ulnar aspect of the wrist. Electronically Signed   By: Lahoma Crocker M.D.   On: 08/05/2015 15:35    Review of Systems  Constitutional: Negative for fever and chills.  Musculoskeletal: Positive for joint pain.  Neurological: Negative for tingling and sensory change.   Blood pressure 99/61, pulse 61, temperature 97.6 F (36.4 C), temperature source Oral, resp. rate 16, height _0  (1.803 m), weight 151 lb 3.2 oz (68.584 kg), SpO2 98 %. Physical Exam  Constitutional: He is oriented to person, place, and time. He appears well-developed and well-nourished.  Musculoskeletal:  Inspection of right wrist and forearm. Multiple dog bites are noted over the ulnar styloid. There is redness at the primary site of puncture wound. There is also a volar laceration from the dogbite and multiple abrasions. Erythema is noted in the area. Range of motion has returned to normal. Wrist joint is stable ulnar joint/carpal joint stable. Strength and grip is normal. Skin as described. There is tenderness over the area of the ulnar styloid no tenderness in the form epitrochlear lymph nodes negative and normal sensation is noted in the hand there is no streaking  Neurological: He is alert and oriented to person, place, and time.  Psychiatric: He has a normal mood and affect. His behavior is normal. Judgment and thought content normal.    Assessment/Plan: Plain films are normal 2 sets. Report reviewed as normal soft tissue swelling.  Impression multiple dog bites  unresponsive to Augmentin but responsive to Unasyn and vancomycin  Continue IV antibiotics for additional 24 hours and then reassess doubt surgical intervention needed appropriate antibiotics are needed  Follow C-reactive protein white count and sedimentation rate.  Dennis Wells 08/06/2015, 7:46  AM

## 2015-08-07 LAB — C-REACTIVE PROTEIN: CRP: 1.5 mg/dL — AB (ref <1.0)

## 2015-08-07 MED ORDER — SULFAMETHOXAZOLE-TRIMETHOPRIM 800-160 MG PO TABS: 2.0000 | ORAL_TABLET | Freq: Two times a day (BID) | ORAL | Status: DC

## 2015-08-07 MED ORDER — HYDROCODONE-ACETAMINOPHEN 5-325 MG PO TABS: 1.0000 | ORAL_TABLET | Freq: Four times a day (QID) | ORAL | Status: DC | PRN

## 2015-08-07 NOTE — Progress Notes (Signed)
AVS reviewed with patient.  Verbalized understanding of discharge instructions, physician follow-up, medications.  Prescription for Hydrocodone given to patient prior to discharge.  Patient's IV removed.  Site WNL.  Patient reports belongings intact and in possession at time of discharge.  Patient refused wheelchair and ambulated to main entrance escorted by NT for discharge.  Patient stable at time of time of discharge.

## 2015-08-07 NOTE — Progress Notes (Signed)
  PROGRESS NOTE  Dennis FlashJohnathan A Ferraz WJX:[id number] DOB: 27-May-1965 DOA: 08/05/2015 PCP: Billee CashingMCKENZIE, WAYLAND, MD  Brief Narrative: [age] yom sustained dog bite on right wrist recently, treated with Augmentin, given tetanus injection and discharged from the emergency department presented with worsening swelling and pain despite Augmentin.  Assessment/Plan: 1. Cellulitis of the right wrist, secondary to dog bite. Failed outpatient therapy with Augmentin. Rapidly improved on Zosyn and vancomycin. Hand uninvolved. No surgery per orthopedics. Discussed with Dr. Romeo AppleHarrison today, located discharge home with follow-up in one week in his office.   Overall rapidly improving. Change to oral antibiotics. Appreciate orthopedic recommendations. Discharge home today.   Brendia Sacksaniel Goodrich, MD  [facility] Direct contact:  --Via amion app OR  --[email or web address]; password [email or web address] and click  7PM-7AM contact night coverage as above 08/07/2015, 7:17 AM  LOS: 1 day   Consultants:  Orthopedics  Procedures:  None  Antimicrobials:  Vancomycin 5/15>> 5/16  Zosyn 5/14>> 5/16  Bactrim 5/16 >> 5/22  HPI/Subjective: Feels better today. Is able to move wrist better. Less pain. No new complaints. Wants to go home.  Objective: Filed Vitals:   08/06/15 0458 08/06/15 1319 08/06/15 2136 08/07/15 0607  BP:  99/62 101/62 108/71  Pulse:  64 64 72  Temp:  97.5 F (36.4 C) 98.2 F (36.8 C) 98.7 F (37.1 C)  TempSrc:  Oral Oral Oral  Resp:  20 20 20   Height:      Weight: 68.584 kg (151 lb 3.2 oz)   68.21 kg (150 lb 6 oz)  SpO2:  97% 99% 100%    Intake/Output Summary (Last 24 hours) at 08/07/15 0717 Last data filed at 08/07/15 0645  Gross per 24 hour  Intake   1840 ml  Output   1750 ml  Net     90 ml     Filed Weights   08/05/15 1824 08/06/15 0458 08/07/15 0607  Weight: 67.722 kg (149 lb 4.8 oz) 68.584 kg (151 lb 3.2 oz) 68.21 kg (150 lb 6 oz)    Exam: Constitutional:  . Appears calm and  comfortable Respiratory:  . CTA bilaterally, no w/r/r.  . Respiratory effort normal. No retractions or accessory muscle use Cardiovascular:  . RRR, no m/r/g . No LE extremity edema   Skin:  . Right wrist area of erythema and swelling over ulnar head. No fluctuance. No warmth. Healing abrasion on volar distal radius/wrist.  Psychiatric:  . judgement and insight appear normal . Mental status o Mood, affect appropriate  I have personally reviewed following labs and imaging studies:  No new data  Scheduled Meds: . enoxaparin (LOVENOX) injection  40 mg Subcutaneous Q24H  . gabapentin  300 mg Oral TID  . piperacillin-tazobactam (ZOSYN)  IV  3.375 g Intravenous Q8H  . vancomycin  1,250 mg Intravenous Q12H   Continuous Infusions:   Principal Problem:   Cellulitis Active Problems:   Infected dog bite   LOS: 1 day     By signing my name below, I, Adron BeneGreylon Gawaluck, attest that this documentation has been prepared under the direction and in the presence of Daniel P. Irene LimboGoodrich, MD. Electronically Signed: Adron BeneGreylon Gawaluck, Scribe.  08/07/2015 10:45am   I personally performed the services described in this documentation. All medical record entries made by the scribe were at my direction. I have reviewed the chart and agree that the record reflects my personal performance and is accurate and complete. Brendia Sacksaniel Goodrich, MD

## 2015-08-07 NOTE — Discharge Summary (Addendum)
Physician Discharge Summary  Dennis Wells ZOX:[id number] DOB: 08-04-1965 DOA: 08/05/2015  PCP: Billee Cashing, MD  Admit date: 08/05/2015 Discharge date: 08/07/2015  Recommendations for Outpatient Follow-up:  1. Follow-up resolution of right wrist infection status post dogbite   Follow-up Information    Follow up with Billee Cashing, MD.   Specialty:  Family Medicine   Why:  As needed   Contact information:   [address] [place] [address] [phone]       Follow up with Fuller Canada, MD On 08/14/2015.   Specialties:  Orthopedic Surgery, Radiology   Why:  Appointment May 23rd at 10:00am   Contact information:   [address] [place] [address] [phone]      Discharge Diagnoses:  1. CellulitisOf the right wrist, secondary to dog bite.  Discharge Condition: Improved Disposition: home  Diet recommendation: regular  Filed Weights   08/05/15 1824 08/06/15 0458 08/07/15 0607  Weight: 67.722 kg (149 lb 4.8 oz) 68.584 kg (151 lb 3.2 oz) 68.21 kg (150 lb 6 oz)    History of present illness:  49 yom sustained dog bite on right wrist recently, treated with Augmentin, given tetanus injection and discharged from the emergency department presented with worsening swelling and pain despite Augmentin.  Hospital Course:  Treated with IV antibiotics with rapid clinical improvement, evaluated by orthopedics with recommendations for conservative therapy. Condition improved rapidly, stable for discharge. He will follow up with orthopedics in one week.  1. Cellulitis of the right wrist, secondary to dog bite. Failed outpatient therapy with Augmentin. Rapidly improved on Zosyn and vancomycin. Hand uninvolved. No surgery per orthopedics. Discussed with Dr. Romeo Apple today, located discharge home with follow-up in one week in his office.  Consultants:  Orthopedics  Procedures:  None  Antimicrobials:  Vancomycin 5/15>> 5/16  Zosyn 5/14>>  5/16  Bactrim 5/16 >> 5/22  Discharge Instructions  Discharge Instructions    Diet - low sodium heart healthy    Complete by:  As directed      Discharge instructions    Complete by:  As directed   Call physician or seek immediate medical attention for increased pain, swelling, redness, numbness or worsening of condition.     Increase activity slowly    Complete by:  As directed           Current Discharge Medication List    START taking these medications   Details  sulfamethoxazole-trimethoprim (BACTRIM DS,SEPTRA DS) 800-160 MG tablet Take 2 tablets by mouth every 12 (twelve) hours. Qty: 44 tablet, Refills: 0      CONTINUE these medications which have CHANGED   Details  HYDROcodone-acetaminophen (NORCO/VICODIN) 5-325 MG tablet Take 1-2 tablets by mouth every 6 (six) hours as needed for severe pain. Qty: 20 tablet, Refills: 0      CONTINUE these medications which have NOT CHANGED   Details  clonazePAM (KLONOPIN) 1 MG tablet Take 1 mg by mouth 3 (three) times daily as needed for anxiety.  Refills: 5    gabapentin (NEURONTIN) 300 MG capsule Take 300 mg by mouth 3 (three) times daily.    Aspirin-Salicylamide-Caffeine (BC FAST PAIN RELIEF) 650-195-33.3 MG PACK Take 1 packet by mouth daily as needed. Pain      STOP taking these medications     amoxicillin-clavulanate (AUGMENTIN) 875-125 MG tablet        Allergies  Allergen Reactions  . Cephalexin Itching    The results of significant diagnostics from this hospitalization (including imaging, microbiology,  ancillary and laboratory) are listed below for reference.    Significant Diagnostic Studies: Dg Wrist Complete Right  08/05/2015  CLINICAL DATA:  Right wrist pain, dog bite 08/02/2015, redness and swelling EXAM: RIGHT WRIST - COMPLETE 3+ VIEW COMPARISON:  08/02/2015 FINDINGS: Four views of the right wrist submitted. No acute fracture or subluxation. Mild soft tissue swelling ulnar aspect of the wrist. No radiopaque  foreign body. IMPRESSION: No acute fracture or subluxation. Mild soft tissue swelling ulnar aspect of the wrist. Electronically Signed   By: Natasha MeadLiviu  Pop M.D.   On: 08/05/2015 15:35   Dg Wrist Complete Right  08/02/2015  CLINICAL DATA:  Dog bite to the wrist.  Initial encounter. EXAM: RIGHT WRIST - COMPLETE 3+ VIEW COMPARISON:  None. FINDINGS: Soft tissue gas and swelling related to puncture wounds. No opaque foreign body or fracture. IMPRESSION: Soft tissue injury without opaque foreign body or fracture. Electronically Signed   By: Marnee SpringJonathon  Watts M.D.   On: 08/02/2015 15:13    Microbiology: Recent Results (from the past 240 hour(s))  Culture, blood (routine x 2)     Status: None (Preliminary result)   Collection Time: 08/05/15  3:27 PM  Result Value Ref Range Status   Specimen Description BLOOD LEFT ANTECUBITAL DRAWN BY RN  Final   Special Requests BOTTLES DRAWN AEROBIC AND ANAEROBIC 4CC  Final   Culture NO GROWTH 2 DAYS  Final   Report Status PENDING  Incomplete  Culture, blood (routine x 2)     Status: None (Preliminary result)   Collection Time: 08/05/15  8:12 PM  Result Value Ref Range Status   Specimen Description BLOOD RIGHT HAND  Final   Special Requests BOTTLES DRAWN AEROBIC ONLY 4CC  Final   Culture NO GROWTH 2 DAYS  Final   Report Status PENDING  Incomplete     Labs: Basic Metabolic Panel:  Recent Labs Lab 08/05/15 1521  NA 134*  K 3.0*  CL 98*  CO2 30  GLUCOSE 98  BUN 11  CREATININE 0.82  CALCIUM 9.1   CBC:  Recent Labs Lab 08/05/15 1521 08/06/15 0450  WBC 9.1 7.0  NEUTROABS 6.9 3.6  HGB 13.4 12.3*  HCT 42.2 38.0*  MCV 88.7 89.6  PLT 335 328    Principal Problem:   Cellulitis Active Problems:   Infected dog bite   Time coordinating discharge: 25 minutes Signed:  Brendia Sacksaniel Goodrich, MD [facility] 08/07/2015, 4:57 PM  By signing my name below, I, Adron BeneGreylon Gawaluck, attest that this documentation has been prepared under the direction and in  the presence of Daniel P. Irene LimboGoodrich, MD. Electronically Signed: Adron BeneGreylon Gawaluck, Scribe.  08/07/2015 10:45am  I personally performed the services described in this documentation. All medical record entries made by the scribe were at my direction. I have reviewed the chart and agree that the record reflects my personal performance and is accurate and complete. Brendia Sacksaniel Goodrich, MD

## 2015-08-10 LAB — CULTURE, BLOOD (ROUTINE X 2)
CULTURE: NO GROWTH
CULTURE: NO GROWTH

## 2015-08-14 ENCOUNTER — Ambulatory Visit: Payer: Medicare Other | Admitting: Orthopedic Surgery

## 2015-08-15 ENCOUNTER — Encounter: Payer: Self-pay | Admitting: Orthopedic Surgery

## 2015-08-25 ENCOUNTER — Emergency Department (HOSPITAL_COMMUNITY)
Admission: EM | Admit: 2015-08-25 | Discharge: 2015-08-25 | Disposition: A | Discharge status: Home / Self Care | Payer: Medicare Other | Attending: Emergency Medicine | Admitting: Emergency Medicine

## 2015-08-25 ENCOUNTER — Encounter (HOSPITAL_COMMUNITY): Payer: Self-pay

## 2015-08-25 ENCOUNTER — Emergency Department (HOSPITAL_COMMUNITY): Payer: Medicare Other

## 2015-08-25 DIAGNOSIS — S92912A Unspecified fracture of left toe(s), initial encounter for closed fracture: Secondary | ICD-10-CM

## 2015-08-25 DIAGNOSIS — Y939 Activity, unspecified: Secondary | ICD-10-CM | POA: Insufficient documentation

## 2015-08-25 DIAGNOSIS — W03XXXA Other fall on same level due to collision with another person, initial encounter: Secondary | ICD-10-CM | POA: Diagnosis not present

## 2015-08-25 DIAGNOSIS — Z7982 Long term (current) use of aspirin: Secondary | ICD-10-CM | POA: Insufficient documentation

## 2015-08-25 DIAGNOSIS — S99922A Unspecified injury of left foot, initial encounter: Secondary | ICD-10-CM | POA: Diagnosis present

## 2015-08-25 DIAGNOSIS — Y999 Unspecified external cause status: Secondary | ICD-10-CM | POA: Insufficient documentation

## 2015-08-25 DIAGNOSIS — S92412A Displaced fracture of proximal phalanx of left great toe, initial encounter for closed fracture: Secondary | ICD-10-CM | POA: Diagnosis not present

## 2015-08-25 DIAGNOSIS — Z87891 Personal history of nicotine dependence: Secondary | ICD-10-CM | POA: Insufficient documentation

## 2015-08-25 DIAGNOSIS — Y92828 Other wilderness area as the place of occurrence of the external cause: Secondary | ICD-10-CM | POA: Diagnosis not present

## 2015-08-25 MED ORDER — HYDROCODONE-ACETAMINOPHEN 5-325 MG PO TABS
1.0000 | ORAL_TABLET | Freq: Once | ORAL | Status: AC
  Administered 2015-08-25: 1 via ORAL
  Filled 2015-08-25: qty 1

## 2015-08-25 NOTE — Discharge Instructions (Signed)
1. Medications: use home hydrocodone as prescribed for pain control, usual home medications 2. Treatment: rest, ice, elevate and use post-op shoe, drink plenty of fluids, No walking on your foot until seen by Ortho 3. Follow Up: Please followup with orthopedics as directed in 3-5 days if no improvement for discussion of your diagnoses and further evaluation after today's visit; if you do not have a primary care doctor use the resource guide provided to find one; Please return to the ER for worsening symptoms or other concerns
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
## 2016-01-11 ENCOUNTER — Ambulatory Visit: Payer: Medicare Other | Admitting: Podiatry

## 2017-04-25 IMAGING — DX DG WRIST COMPLETE 3+V*R*
4 series · 4 of 4 positions shown · non-contrast
Comparison: None.

CLINICAL DATA: Dog bite to the wrist.  Initial encounter.

EXAM:
RIGHT WRIST - COMPLETE 3+ VIEW

[wrist pa]
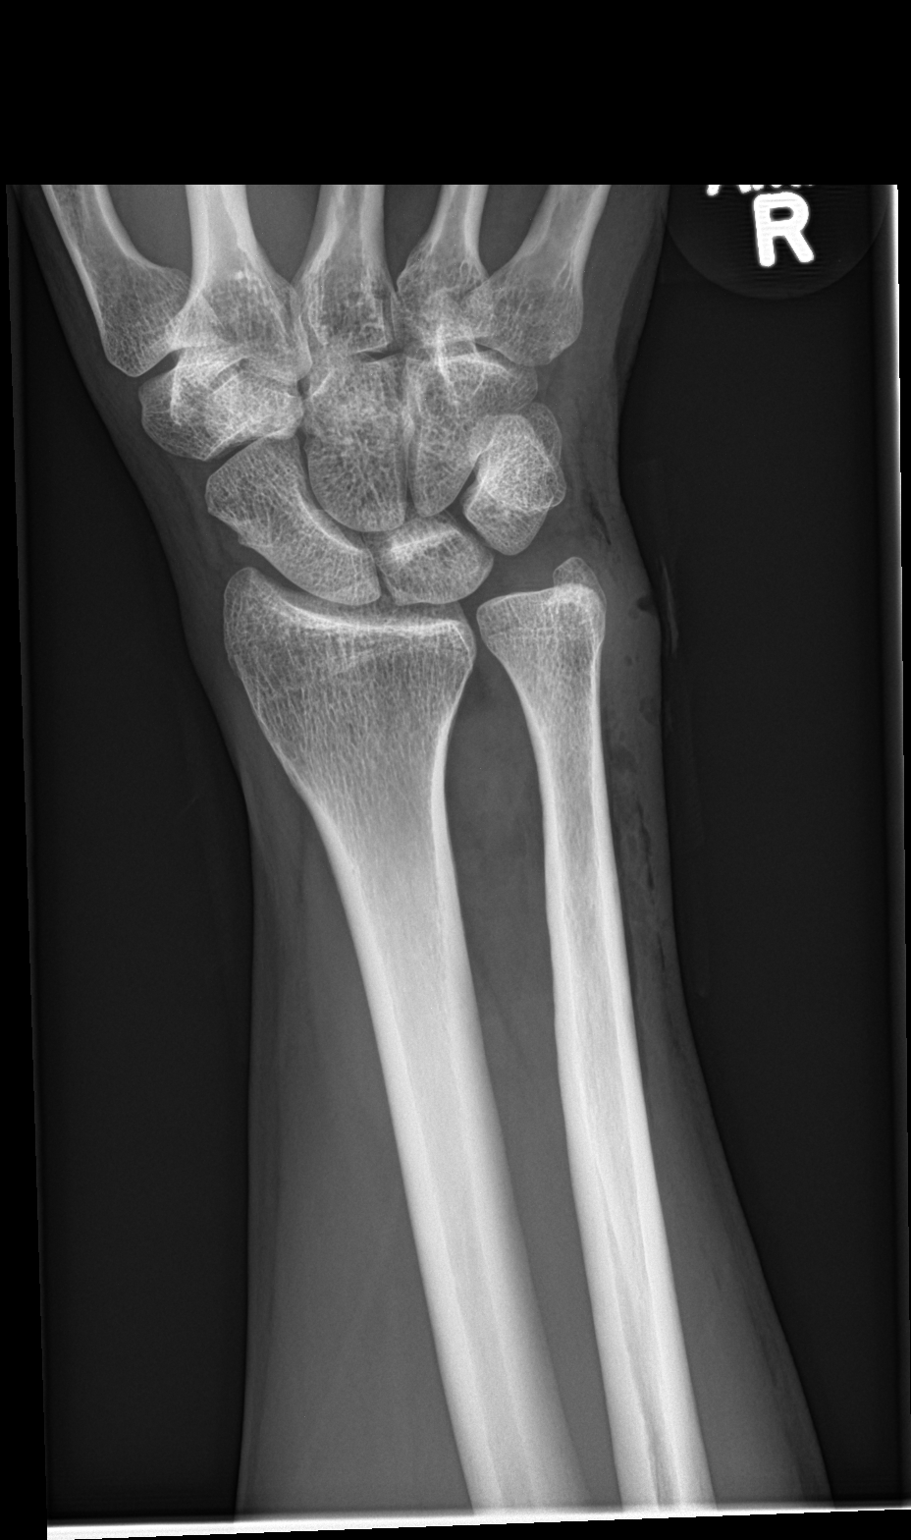

[wrist navicular]
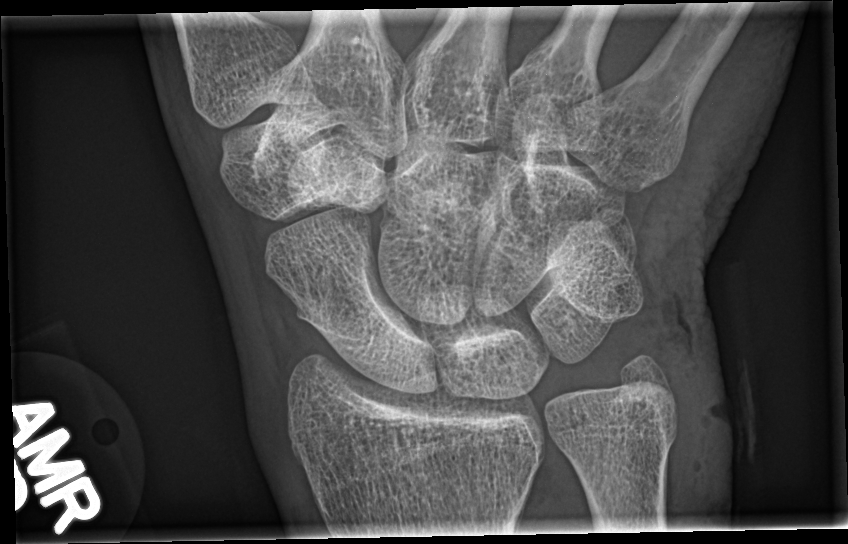

[wrist obl]
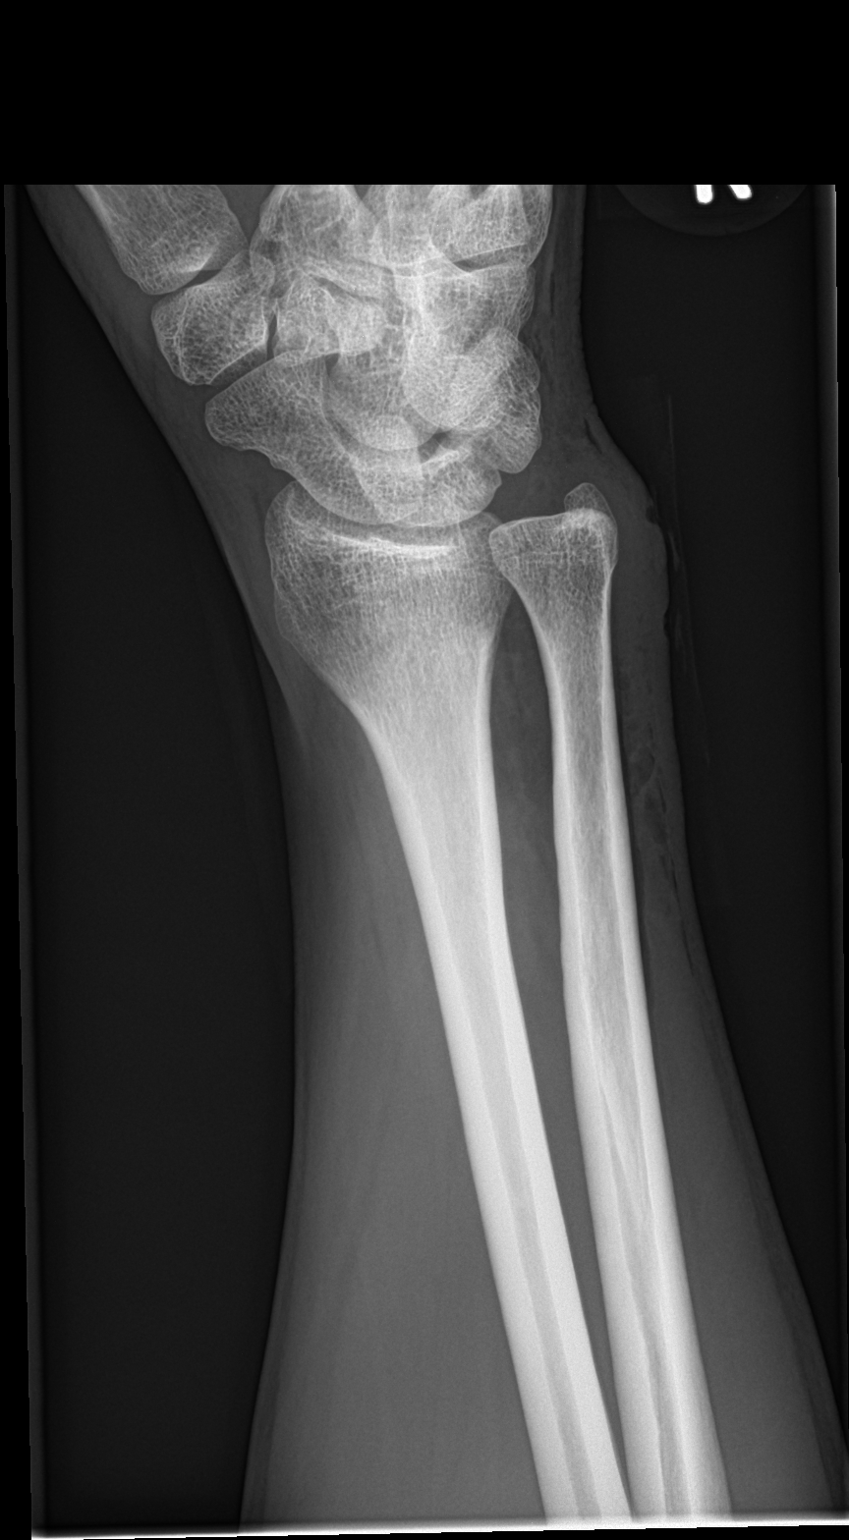

[wrist lat]
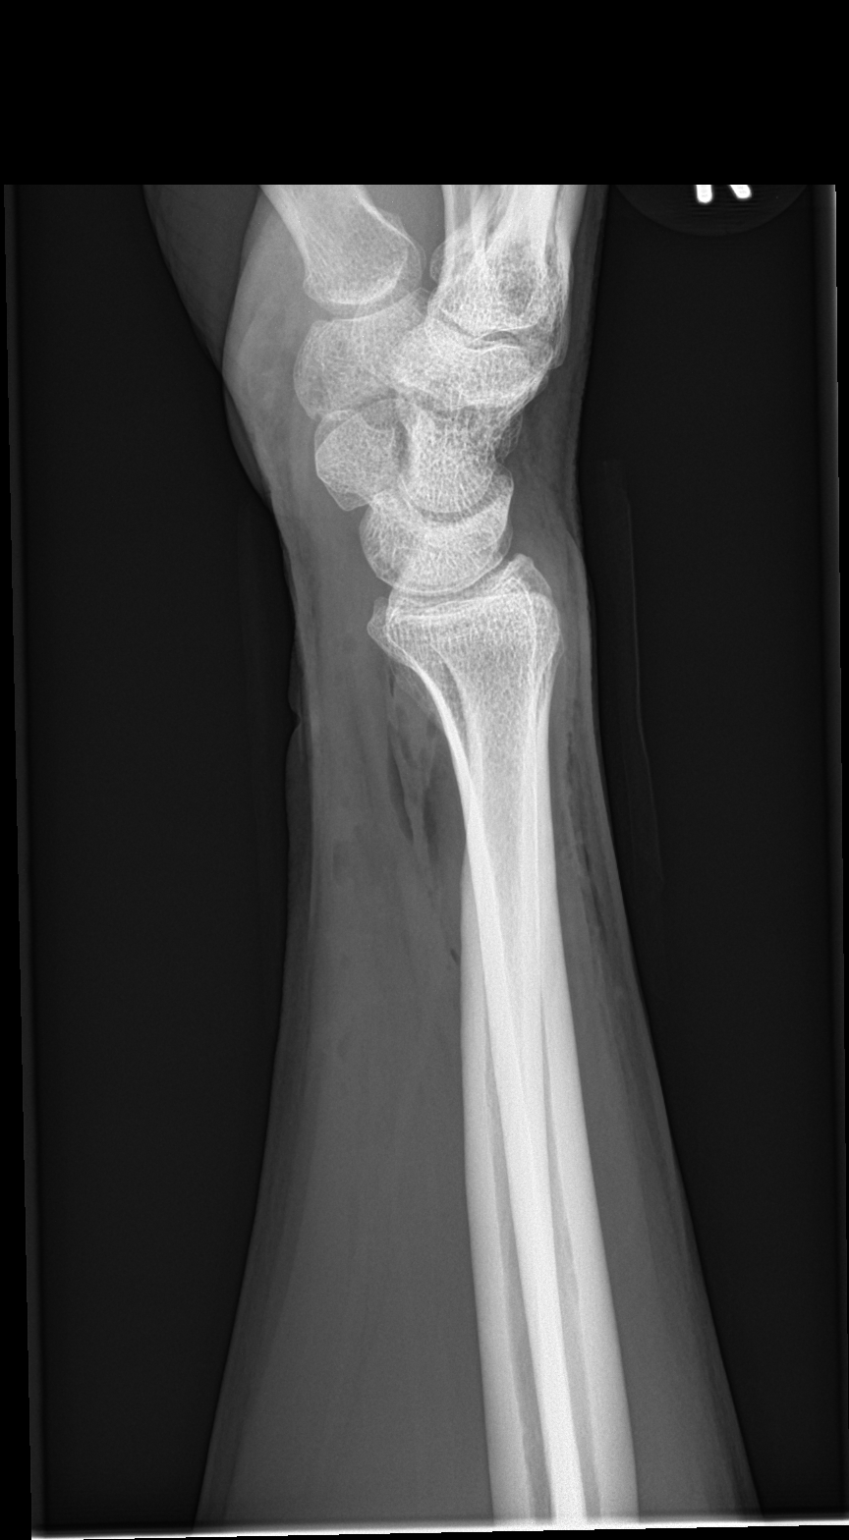

[4 of 4 positions shown; findings below may reference images not displayed]

FINDINGS: Soft tissue gas and swelling related to puncture wounds. No opaque
foreign body or fracture.
IMPRESSION: Soft tissue injury without opaque foreign body or fracture.

## 2017-05-18 IMAGING — DX DG FOOT COMPLETE 3+V*L*
3 series · 3 of 3 positions shown · non-contrast
Comparison: None.

CLINICAL DATA: Pain following fall

EXAM:
LEFT FOOT - COMPLETE 3+ VIEW

[foot ap]
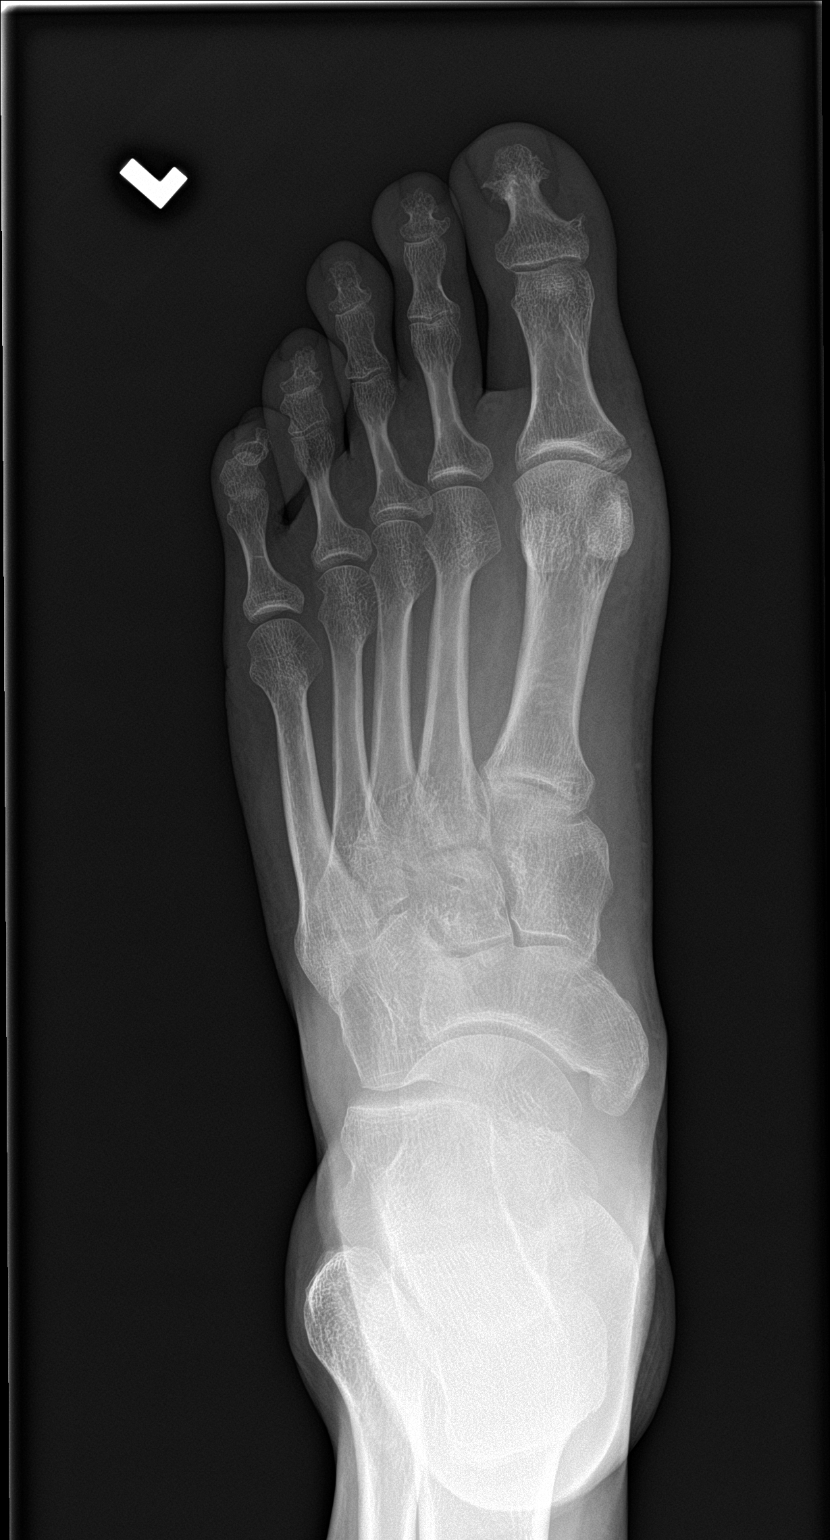

[foot obl]
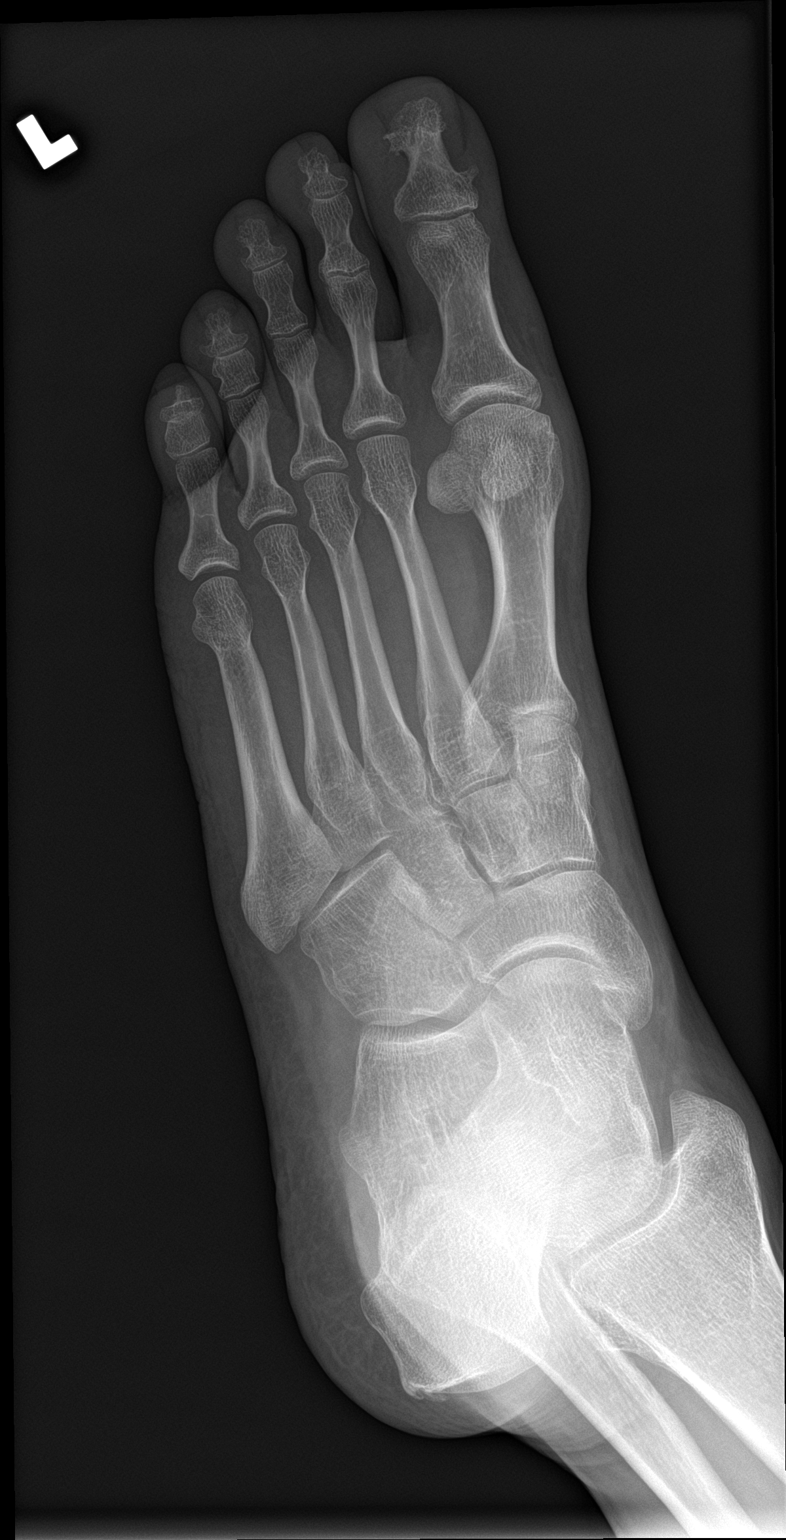

[foot lat]
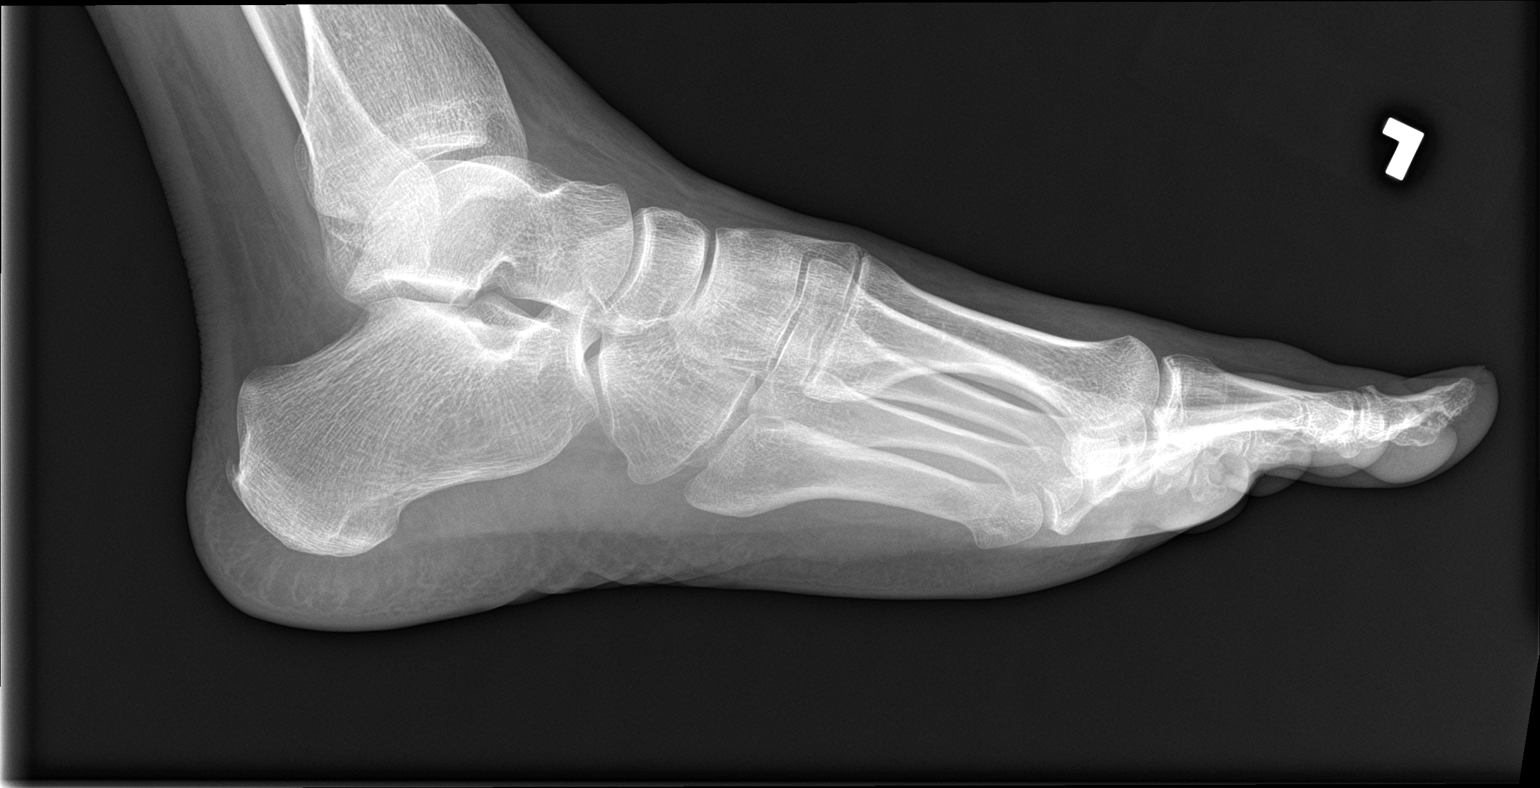

[3 of 3 positions shown; findings below may reference images not displayed]

FINDINGS: Frontal, oblique, and lateral views were obtained. There is an
obliquely oriented fracture along the medial aspect of the proximal
portion of the first proximal phalanx. Alignment is near anatomic.
No other fracture. No dislocation. Joint spaces appear normal. There
is a minimal posterior calcaneal spur.
IMPRESSION: Obliquely oriented fracture along the medial aspect of the proximal
portion of the first proximal phalanx with alignment near anatomic.
No other fracture. No dislocation. No appreciable joint space
narrowing.

## 2017-10-13 ENCOUNTER — Ambulatory Visit (HOSPITAL_COMMUNITY)
Admission: RE | Admit: 2017-10-13 | Discharge: 2017-10-13 | Disposition: A | Discharge status: Home / Self Care | Payer: Medicare Other | Source: Ambulatory Visit | Attending: Neurology | Admitting: Neurology

## 2017-10-13 ENCOUNTER — Other Ambulatory Visit (HOSPITAL_COMMUNITY): Payer: Self-pay | Admitting: Neurology

## 2017-10-13 DIAGNOSIS — G8929 Other chronic pain: Secondary | ICD-10-CM | POA: Diagnosis not present

## 2017-10-13 DIAGNOSIS — M546 Pain in thoracic spine: Secondary | ICD-10-CM

## 2017-10-13 DIAGNOSIS — M545 Low back pain: Secondary | ICD-10-CM

## 2021-09-19 ENCOUNTER — Encounter: Payer: Self-pay | Admitting: Family Medicine

## 2021-09-19 ENCOUNTER — Ambulatory Visit (INDEPENDENT_AMBULATORY_CARE_PROVIDER_SITE_OTHER): Payer: Medicare Other | Admitting: Family Medicine

## 2021-09-19 VITALS — BP 113/79 | HR 99 | Temp 97.2°F | Ht 71.0 in | Wt 124.8 lb

## 2021-09-19 DIAGNOSIS — M5432 Sciatica, left side: Secondary | ICD-10-CM | POA: Diagnosis not present

## 2021-09-19 DIAGNOSIS — M25552 Pain in left hip: Secondary | ICD-10-CM | POA: Diagnosis not present

## 2021-09-19 DIAGNOSIS — G8929 Other chronic pain: Secondary | ICD-10-CM | POA: Diagnosis not present

## 2021-09-19 MED ORDER — PREDNISONE 20 MG PO TABS: ORAL_TABLET | ORAL | 0 refills | Status: DC

## 2021-09-19 NOTE — Progress Notes (Signed)
Subjective:  Patient ID: Dennis Wells, male    DOB: June 26, 1965, 56 y.o.   MRN: [id number]  Patient Care Team: Sonny Masters, FNP as PCP - General (Family Medicine)   Chief Complaint:  Establish Care   HPI: Dennis Wells is a 56 y.o. male presenting on 09/19/2021 for Establish Care   Pt presents today to establish care with new PCP and for evaluation and treatment of ongoing left hip pain. EHR database is incomplete and records have been requested. He states he fell from a porch over 2 years ago causing pain to his hip. He was not seen at the time of the fall and states he has not had imaging. He states he was prescribed suboxone, clonazepam, and gabapentin by Dr. Gerilyn Pilgrim for management of his pain. States "I am not a junkie" and that is not why I am on suboxone. States he is discriminated against by DSS and the police due to being on this medication. PDMP reviewed and pt last had medications filled in 12/2020. He was seen 03/2021 at [facility] for hip pain. Imaging was ordered and pt did not complete. He was offered joint injections and PT. Pt reports he does not believe in this modality of treatment. States he is just here for a pain management treatment plan.  He denies any other medical history. Last labs in EHR from 2017. Awaiting other prior medical records.     Relevant past medical, surgical, family, and social history reviewed and updated as indicated.  Allergies and medications reviewed and updated. Data reviewed: Chart in Epic.   Past Medical History:  Diagnosis Date   Chronic back pain     History reviewed. No pertinent surgical history.  Social History   Socioeconomic History   Marital status: Legally Separated    Spouse name: Not on file   Number of children: Not on file   Years of education: Not on file   Highest education level: Not on file  Occupational History   Not on file  Tobacco Use   Smoking status: Every Day    Packs/day: 1.00    Years:  30.00    Total pack years: 30.00    Types: Cigarettes    Last attempt to quit: 03/23/2013    Years since quitting: 8.4   Smokeless tobacco: Not on file  Substance and Sexual Activity   Alcohol use: No    Alcohol/week: 0.0 standard drinks of alcohol   Drug use: Not Currently   Sexual activity: Not on file  Other Topics Concern   Not on file  Social History Narrative   Not on file   Social Determinants of Health   Financial Resource Strain: Not on file  Food Insecurity: Not on file  Transportation Needs: Not on file  Physical Activity: Not on file  Stress: Not on file  Social Connections: Not on file  Intimate Partner Violence: Not on file    Outpatient Encounter Medications as of 09/19/2021  Medication Sig   predniSONE (DELTASONE) 20 MG tablet 2 po at sametime daily for 5 days- start tomorrow   [DISCONTINUED] Aspirin-Salicylamide-Caffeine (BC FAST PAIN RELIEF) 650-195-33.3 MG PACK Take 1 packet by mouth daily as needed. Pain   [DISCONTINUED] clonazePAM (KLONOPIN) 1 MG tablet Take 1 mg by mouth 3 (three) times daily as needed for anxiety.    [DISCONTINUED] gabapentin (NEURONTIN) 300 MG capsule Take 300 mg by mouth 3 (three) times daily.   [DISCONTINUED] HYDROcodone-acetaminophen (NORCO/VICODIN) 5-325 MG tablet  Take 1-2 tablets by mouth every 6 (six) hours as needed for severe pain.   [DISCONTINUED] sulfamethoxazole-trimethoprim (BACTRIM DS,SEPTRA DS) 800-160 MG tablet Take 2 tablets by mouth every 12 (twelve) hours.   No facility-administered encounter medications on file as of 09/19/2021.    Allergies  Allergen Reactions   Cephalexin Itching    Review of Systems  Constitutional:  Negative for activity change, appetite change, chills, diaphoresis, fatigue, fever and unexpected weight change.  HENT: Negative.    Eyes: Negative.   Respiratory:  Negative for cough, chest tightness and shortness of breath.   Cardiovascular:  Negative for chest pain, palpitations and leg  swelling.  Gastrointestinal:  Negative for abdominal pain, blood in stool, constipation, diarrhea, nausea and vomiting.  Endocrine: Negative.   Genitourinary:  Negative for decreased urine volume, difficulty urinating, dysuria, frequency and urgency.  Musculoskeletal:  Positive for arthralgias, gait problem and myalgias. Negative for joint swelling, neck pain and neck stiffness.  Skin: Negative.   Allergic/Immunologic: Negative.   Neurological:  Negative for dizziness, weakness and headaches.  Hematological: Negative.   Psychiatric/Behavioral:  Negative for confusion, hallucinations, sleep disturbance and suicidal ideas.   All other systems reviewed and are negative.       Objective:  BP 113/79   Pulse 99   Temp (!) 97.2 F (36.2 C)   Ht 5\' 11"  (1.803 m)   Wt 124 lb 12.8 oz (56.6 kg)   SpO2 97%   BMI 17.41 kg/m    Wt Readings from Last 3 Encounters:  09/19/21 124 lb 12.8 oz (56.6 kg)  08/25/15 149 lb (67.6 kg)  08/07/15 150 lb 6 oz (68.2 kg)    Physical Exam Vitals and nursing note reviewed.  Constitutional:      Appearance: He is underweight. He is not ill-appearing, toxic-appearing or diaphoretic.     Comments: Unkept, body odor.   HENT:     Head: Normocephalic and atraumatic.     Mouth/Throat:     Lips: Pink.     Mouth: Mucous membranes are moist.     Dentition: Abnormal dentition (missing teeth). Dental caries present.     Pharynx: No oropharyngeal exudate or posterior oropharyngeal erythema.  Eyes:     Pupils: Pupils are equal, round, and reactive to light.  Cardiovascular:     Rate and Rhythm: Normal rate and regular rhythm.     Heart sounds: Normal heart sounds.  Pulmonary:     Effort: Pulmonary effort is normal.     Breath sounds: Normal breath sounds.  Musculoskeletal:     Thoracic back: Normal.     Lumbar back: No swelling, edema, deformity, signs of trauma, lacerations, spasms, tenderness or bony tenderness. Normal range of motion. Positive left  straight leg raise test. Negative right straight leg raise test. No scoliosis.     Right hip: Normal.     Left hip: Tenderness present. No lacerations, bony tenderness or crepitus. Decreased range of motion. Normal strength.     Right upper leg: Normal.     Left upper leg: Normal.     Right lower leg: No edema.     Left lower leg: No edema.  Skin:    General: Skin is warm and dry.     Capillary Refill: Capillary refill takes less than 2 seconds.  Neurological:     General: No focal deficit present.     Mental Status: He is alert and oriented to person, place, and time.  Psychiatric:  Mood and Affect: Mood normal.        Behavior: Behavior normal. Behavior is cooperative.        Thought Content: Thought content normal.        Judgment: Judgment normal.     Results for orders placed or performed during the hospital encounter of 08/05/15  Culture, blood (routine x 2)   Specimen: BLOOD  Result Value Ref Range   Specimen Description BLOOD LEFT ANTECUBITAL DRAWN BY RN    Special Requests BOTTLES DRAWN AEROBIC AND ANAEROBIC 4CC    Culture NO GROWTH 5 DAYS    Report Status 08/10/2015 FINAL   Culture, blood (routine x 2)   Specimen: BLOOD RIGHT HAND  Result Value Ref Range   Specimen Description BLOOD RIGHT HAND    Special Requests BOTTLES DRAWN AEROBIC ONLY 4CC    Culture NO GROWTH 5 DAYS    Report Status 08/10/2015 FINAL   CBC with Differential  Result Value Ref Range   WBC 9.1 4.0 - 10.5 K/uL   RBC 4.76 4.22 - 5.81 MIL/uL   Hemoglobin 13.4 13.0 - 17.0 g/dL   HCT 61.4 43.1 - 54.0 %   MCV 88.7 78.0 - 100.0 fL   MCH 28.2 26.0 - 34.0 pg   MCHC 31.8 30.0 - 36.0 g/dL   RDW 08.6 76.1 - 95.0 %   Platelets 335 150 - 400 K/uL   Neutrophils Relative % 76 %   Neutro Abs 6.9 1.7 - 7.7 K/uL   Lymphocytes Relative 14 %   Lymphs Abs 1.3 0.7 - 4.0 K/uL   Monocytes Relative 9 %   Monocytes Absolute 0.8 0.1 - 1.0 K/uL   Eosinophils Relative 1 %   Eosinophils Absolute 0.1 0.0 - 0.7  K/uL   Basophils Relative 0 %   Basophils Absolute 0.0 0.0 - 0.1 K/uL  Basic metabolic panel  Result Value Ref Range   Sodium 134 (L) 135 - 145 mmol/L   Potassium 3.0 (L) 3.5 - 5.1 mmol/L   Chloride 98 (L) 101 - 111 mmol/L   CO2 30 22 - 32 mmol/L   Glucose, Bld 98 65 - 99 mg/dL   BUN 11 6 - 20 mg/dL   Creatinine, Ser 9.32 0.61 - 1.24 mg/dL   Calcium 9.1 8.9 - 67.1 mg/dL   GFR calc non Af Amer >60 >60 mL/min   GFR calc Af Amer >60 >60 mL/min   Anion gap 6 5 - 15  Sedimentation rate  Result Value Ref Range   Sed Rate 34 (H) 0 - 16 mm/hr  CBC WITH DIFFERENTIAL  Result Value Ref Range   WBC 7.0 4.0 - 10.5 K/uL   RBC 4.24 4.22 - 5.81 MIL/uL   Hemoglobin 12.3 (L) 13.0 - 17.0 g/dL   HCT 24.5 (L) 80.9 - 98.3 %   MCV 89.6 78.0 - 100.0 fL   MCH 29.0 26.0 - 34.0 pg   MCHC 32.4 30.0 - 36.0 g/dL   RDW 38.2 50.5 - 39.7 %   Platelets 328 150 - 400 K/uL   Neutrophils Relative % 51 %   Neutro Abs 3.6 1.7 - 7.7 K/uL   Lymphocytes Relative 31 %   Lymphs Abs 2.2 0.7 - 4.0 K/uL   Monocytes Relative 12 %   Monocytes Absolute 0.9 0.1 - 1.0 K/uL   Eosinophils Relative 6 %   Eosinophils Absolute 0.4 0.0 - 0.7 K/uL   Basophils Relative 0 %   Basophils Absolute 0.0 0.0 - 0.1 K/uL  C-reactive  protein  Result Value Ref Range   CRP 1.5 (H) <1.0 mg/dL       Pertinent labs & imaging results that were available during my care of the patient were reviewed by me and considered in my medical decision making.  Assessment & Plan:  Dennis Wells was seen today for establish care.  Diagnoses and all orders for this visit:  Chronic left hip pain Left sided sciatica  Ongoing for over 2 years, denies previous imaging of hip. Will obtain. Was followed by Dr. Gerilyn Pilgrim for pain management in the past. States he has not seen him in several months. Will place new referral to pain management today. Pt aware we do not manage chronic pain in this office. Due to sciatica symptoms, will burst with steroids. No red  flags concerning for cauda equina syndrome. Pt aware to report new, worsening, or persistent symptoms. Pt to make an appointment for a CPE in next 4-6 weeks.  -     DG HIP UNILAT W OR W/O PELVIS 2-3 VIEWS LEFT; Future -     Ambulatory referral to Pain Clinic -     predniSONE (DELTASONE) 20 MG tablet; 2 po at sametime daily for 5 days- start tomorrow    Continue all other maintenance medications.  Follow up plan: Return in about 4 weeks (around 10/17/2021), or if symptoms worsen or fail to improve, for CPE.   Continue healthy lifestyle choices, including diet (rich in fruits, vegetables, and lean proteins, and low in salt and simple carbohydrates) and exercise (at least 30 minutes of moderate physical activity daily).  Educational handout given for hip pain  The above assessment and management plan was discussed with the patient. The patient verbalized understanding of and has agreed to the management plan. Patient is aware to call the clinic if they develop any new symptoms or if symptoms persist or worsen. Patient is aware when to return to the clinic for a follow-up visit. Patient educated on when it is appropriate to go to the emergency department.   Kari Baars, FNP-C [facility] [phone]

## 2021-09-23 ENCOUNTER — Ambulatory Visit (INDEPENDENT_AMBULATORY_CARE_PROVIDER_SITE_OTHER): Payer: Medicare Other

## 2021-09-23 DIAGNOSIS — M25552 Pain in left hip: Secondary | ICD-10-CM | POA: Diagnosis not present

## 2021-09-23 DIAGNOSIS — G8929 Other chronic pain: Secondary | ICD-10-CM | POA: Diagnosis not present

## 2021-09-26 ENCOUNTER — Other Ambulatory Visit: Payer: Self-pay | Admitting: Family Medicine

## 2021-09-26 DIAGNOSIS — G8929 Other chronic pain: Secondary | ICD-10-CM

## 2021-10-01 ENCOUNTER — Ambulatory Visit (INDEPENDENT_AMBULATORY_CARE_PROVIDER_SITE_OTHER): Payer: Medicare Other | Admitting: Family Medicine

## 2021-10-01 ENCOUNTER — Encounter: Payer: Self-pay | Admitting: Family Medicine

## 2021-10-01 VITALS — BP 129/82 | HR 86 | Temp 98.8°F | Ht 71.0 in | Wt 127.0 lb

## 2021-10-01 DIAGNOSIS — G8929 Other chronic pain: Secondary | ICD-10-CM

## 2021-10-01 DIAGNOSIS — M25552 Pain in left hip: Secondary | ICD-10-CM | POA: Diagnosis not present

## 2021-10-01 DIAGNOSIS — F321 Major depressive disorder, single episode, moderate: Secondary | ICD-10-CM | POA: Diagnosis not present

## 2021-10-01 NOTE — Progress Notes (Signed)
Subjective:  Patient ID: Dennis Wells, male    DOB: 1965/07/28, 56 y.o.   MRN: [id number]  Patient Care Team: Sonny Masters, FNP as PCP - General (Family Medicine)   Chief Complaint:  chronic pain follow up   HPI: Dennis Wells is a 56 y.o. male presenting on 10/01/2021 for chronic pain follow up   Pt presents today with complaints of ongoing left hip pain. He was seen 09/19/2021 to establish care and for evaluation of chronic left hip pain. He was placed on steroids for treatment of ongoing left hip pain. Prior medical records were requested and imaging of hip was obtained. Imaging was unremarkable. 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 At initial visit, pt denied drug use or abuse. When questioned about this today, pt admitted to opioid use but states he should have never told this to prior provider. He was referred to pain management and orthopedics at initial visit but has not received an appointment.  PHQ screening positive today, negative at last visit. Pt states ongoing depression for years which was treated with gabapentin and Klonopin. States these medications were the only thing that would work for him and he does not wish to take anything else. He has seen psychiatry in the past and states he does not wish to go again as he does not like them. He denies SI or HI.       10/01/2021   12:24 PM 09/19/2021    2:07 PM  Depression screen PHQ 2/9  Decreased Interest 3 0  Down, Depressed, Hopeless 3 0  PHQ - 2 Score 6 0  Altered sleeping 3   Tired, decreased energy 3   Change in appetite 3   Feeling bad or failure about yourself  3   Trouble concentrating 3   Moving slowly or fidgety/restless 3   Suicidal thoughts 0   PHQ-9 Score 24   Difficult doing work/chores Extremely dIfficult       10/01/2021   12:25 PM 09/19/2021    2:07 PM  GAD 7 : Generalized Anxiety Score  Nervous, Anxious, on Edge 3 0   Control/stop worrying 3 0  Worry too much - different things 3 0  Trouble relaxing 3 0  Restless 3 0  Easily annoyed or irritable 3 0  Afraid - awful might happen 2 0  Total GAD 7 Score 20 0  Anxiety Difficulty  Not difficult at all      Relevant past medical, surgical, family, and social history reviewed and updated as indicated.  Allergies and medications reviewed and updated. Data reviewed: Chart in Epic.   Past Medical History:  Diagnosis Date   Chronic back pain     History reviewed. No pertinent surgical history.  Social History   Socioeconomic History   Marital status: Legally Separated    Spouse name: Not on file   Number of children: Not on file   Years of education: Not on file   Highest education level: Not on file  Occupational History   Not on file  Tobacco Use   Smoking status: Every Day    Packs/day: 1.00    Years: 30.00    Total pack years: 30.00    Types: Cigarettes    Last attempt to quit: 03/23/2013    Years since quitting: 8.5   Smokeless tobacco: Not on file  Substance and Sexual Activity   Alcohol use: No  Alcohol/week: 0.0 standard drinks of alcohol   Drug use: Not Currently   Sexual activity: Not on file  Other Topics Concern   Not on file  Social History Narrative   Not on file   Social Determinants of Health   Financial Resource Strain: Not on file  Food Insecurity: Not on file  Transportation Needs: Not on file  Physical Activity: Not on file  Stress: Not on file  Social Connections: Not on file  Intimate Partner Violence: Not on file    Outpatient Encounter Medications as of 10/01/2021  Medication Sig   [DISCONTINUED] predniSONE (DELTASONE) 20 MG tablet 2 po at sametime daily for 5 days- start tomorrow   No facility-administered encounter medications on file as of 10/01/2021.    Allergies  Allergen Reactions   Cephalexin Itching    Review of Systems  Constitutional:  Positive for activity change, appetite change  and fatigue. Negative for chills, diaphoresis, fever and unexpected weight change.  Respiratory:  Negative for cough and shortness of breath.   Cardiovascular:  Negative for chest pain, palpitations and leg swelling.  Gastrointestinal:  Negative for abdominal pain, constipation, diarrhea, nausea and vomiting.  Genitourinary:  Negative for decreased urine volume and difficulty urinating.  Musculoskeletal:  Positive for arthralgias and gait problem. Negative for back pain, joint swelling, myalgias, neck pain and neck stiffness.  Neurological:  Negative for dizziness, weakness and headaches.  Psychiatric/Behavioral:  Positive for agitation, decreased concentration, dysphoric mood and sleep disturbance. Negative for behavioral problems, confusion, hallucinations, self-injury and suicidal ideas. The patient is nervous/anxious. The patient is not hyperactive.   All other systems reviewed and are negative.       Objective:  BP 129/82   Pulse 86   Temp 98.8 F (37.1 C)   Ht 5\' 11"  (1.803 m)   Wt 127 lb (57.6 kg)   SpO2 96%   BMI 17.71 kg/m    Wt Readings from Last 3 Encounters:  10/01/21 127 lb (57.6 kg)  09/19/21 124 lb 12.8 oz (56.6 kg)  08/25/15 149 lb (67.6 kg)    Physical Exam Vitals and nursing note reviewed.  Constitutional:      General: He is not in acute distress.    Appearance: He is normal weight. He is not ill-appearing, toxic-appearing or diaphoretic.  HENT:     Head: Normocephalic and atraumatic.  Eyes:     Pupils: Pupils are equal, round, and reactive to light.  Cardiovascular:     Rate and Rhythm: Normal rate and regular rhythm.     Heart sounds: Normal heart sounds.  Pulmonary:     Effort: Pulmonary effort is normal.     Breath sounds: Normal breath sounds.  Musculoskeletal:     Right lower leg: No edema.     Left lower leg: No edema.  Skin:    General: Skin is warm and dry.     Capillary Refill: Capillary refill takes less than 2 seconds.  Neurological:      General: No focal deficit present.     Mental Status: He is alert and oriented to person, place, and time.     Gait: Gait abnormal (antalgic).  Psychiatric:        Attention and Perception: Attention normal.        Mood and Affect: Affect is inappropriate.        Behavior: Behavior is agitated.     Results for orders placed or performed during the hospital encounter of 08/05/15  Culture, blood (routine  x 2)   Specimen: BLOOD  Result Value Ref Range   Specimen Description BLOOD LEFT ANTECUBITAL DRAWN BY RN    Special Requests BOTTLES DRAWN AEROBIC AND ANAEROBIC 4CC    Culture NO GROWTH 5 DAYS    Report Status 08/10/2015 FINAL   Culture, blood (routine x 2)   Specimen: BLOOD RIGHT HAND  Result Value Ref Range   Specimen Description BLOOD RIGHT HAND    Special Requests BOTTLES DRAWN AEROBIC ONLY 4CC    Culture NO GROWTH 5 DAYS    Report Status 08/10/2015 FINAL   CBC with Differential  Result Value Ref Range   WBC 9.1 4.0 - 10.5 K/uL   RBC 4.76 4.22 - 5.81 MIL/uL   Hemoglobin 13.4 13.0 - 17.0 g/dL   HCT 19.4 17.4 - 08.1 %   MCV 88.7 78.0 - 100.0 fL   MCH 28.2 26.0 - 34.0 pg   MCHC 31.8 30.0 - 36.0 g/dL   RDW 44.8 18.5 - 63.1 %   Platelets 335 150 - 400 K/uL   Neutrophils Relative % 76 %   Neutro Abs 6.9 1.7 - 7.7 K/uL   Lymphocytes Relative 14 %   Lymphs Abs 1.3 0.7 - 4.0 K/uL   Monocytes Relative 9 %   Monocytes Absolute 0.8 0.1 - 1.0 K/uL   Eosinophils Relative 1 %   Eosinophils Absolute 0.1 0.0 - 0.7 K/uL   Basophils Relative 0 %   Basophils Absolute 0.0 0.0 - 0.1 K/uL  Basic metabolic panel  Result Value Ref Range   Sodium 134 (L) 135 - 145 mmol/L   Potassium 3.0 (L) 3.5 - 5.1 mmol/L   Chloride 98 (L) 101 - 111 mmol/L   CO2 30 22 - 32 mmol/L   Glucose, Bld 98 65 - 99 mg/dL   BUN 11 6 - 20 mg/dL   Creatinine, Ser 4.97 0.61 - 1.24 mg/dL   Calcium 9.1 8.9 - 02.6 mg/dL   GFR calc non Af Amer >60 >60 mL/min   GFR calc Af Amer >60 >60 mL/min   Anion gap 6 5 -  15  Sedimentation rate  Result Value Ref Range   Sed Rate 34 (H) 0 - 16 mm/hr  CBC WITH DIFFERENTIAL  Result Value Ref Range   WBC 7.0 4.0 - 10.5 K/uL   RBC 4.24 4.22 - 5.81 MIL/uL   Hemoglobin 12.3 (L) 13.0 - 17.0 g/dL   HCT 37.8 (L) 58.8 - 50.2 %   MCV 89.6 78.0 - 100.0 fL   MCH 29.0 26.0 - 34.0 pg   MCHC 32.4 30.0 - 36.0 g/dL   RDW 77.4 12.8 - 78.6 %   Platelets 328 150 - 400 K/uL   Neutrophils Relative % 51 %   Neutro Abs 3.6 1.7 - 7.7 K/uL   Lymphocytes Relative 31 %   Lymphs Abs 2.2 0.7 - 4.0 K/uL   Monocytes Relative 12 %   Monocytes Absolute 0.9 0.1 - 1.0 K/uL   Eosinophils Relative 6 %   Eosinophils Absolute 0.4 0.0 - 0.7 K/uL   Basophils Relative 0 %   Basophils Absolute 0.0 0.0 - 0.1 K/uL  C-reactive protein  Result Value Ref Range   CRP 1.5 (H) <1.0 mg/dL       Pertinent labs & imaging results that were available during my care of the patient were reviewed by me and considered in my medical decision making.  Assessment & Plan:  Aceyn was seen today for chronic pain follow up.  Diagnoses  and all orders for this visit:  Chronic left hip pain Has been referred to pain management and orthopedics, awaiting appointment. Offered referral to PT, declined. Pt aware we do not manage chronic pain in this office.   Depression, major, single episode, moderate (HCC) Pt requesting gabapentin and klonopin for management of depression symptoms. Pt aware these medications are not indicated for the treatment of depression. Declines other medication options. Referral to psychiatry placed, pt aware but states he may not go as psychiatrists make him "mad". -     Ambulatory referral to Psychiatry     Continue all other maintenance medications.  Follow up plan: Return in about 3 months (around 01/01/2022), or if symptoms worsen or fail to improve, for CPE.    The above assessment and management plan was discussed with the patient. The patient verbalized understanding of  and has agreed to the management plan. Patient is aware to call the clinic if they develop any new symptoms or if symptoms persist or worsen. Patient is aware when to return to the clinic for a follow-up visit. Patient educated on when it is appropriate to go to the emergency department.   Kari Baars, FNP-C [facility] [phone]

## 2021-10-08 ENCOUNTER — Telehealth: Payer: Self-pay | Admitting: Family Medicine

## 2021-10-08 NOTE — Telephone Encounter (Signed)
LM for patient to call me back. Need to see if patient is available tomorrow at 12:30 for N[facility] to call him instead of 2:45.  If patient calls back please transfer him to my direct line [phone].

## 2021-10-09 ENCOUNTER — Ambulatory Visit: Payer: Medicare Other

## 2021-10-09 ENCOUNTER — Ambulatory Visit: Payer: Medicare Other | Admitting: Orthopedic Surgery

## 2021-10-16 ENCOUNTER — Encounter: Payer: Self-pay | Admitting: Orthopedic Surgery

## 2021-10-16 ENCOUNTER — Ambulatory Visit (INDEPENDENT_AMBULATORY_CARE_PROVIDER_SITE_OTHER): Payer: Medicare Other

## 2021-10-16 ENCOUNTER — Ambulatory Visit (INDEPENDENT_AMBULATORY_CARE_PROVIDER_SITE_OTHER): Payer: Medicare Other | Admitting: Orthopedic Surgery

## 2021-10-16 VITALS — Ht 71.0 in | Wt 130.0 lb

## 2021-10-16 DIAGNOSIS — M5416 Radiculopathy, lumbar region: Secondary | ICD-10-CM

## 2021-10-16 MED ORDER — PREDNISONE 10 MG (21) PO TBPK: ORAL_TABLET | ORAL | 0 refills | Status: AC

## 2021-10-16 MED ORDER — CYCLOBENZAPRINE HCL 10 MG PO TABS: 10.0000 mg | ORAL_TABLET | Freq: Two times a day (BID) | ORAL | 0 refills | Status: AC | PRN

## 2021-10-16 NOTE — Patient Instructions (Signed)

## 2021-10-17 ENCOUNTER — Encounter: Payer: Self-pay | Admitting: Orthopedic Surgery

## 2021-10-17 NOTE — Progress Notes (Signed)
New Patient Visit  Assessment: Dennis Wells is a 56 y.o. male with the following: 1. Radiculopathy, lumbar region  Plan: [name] has pain in the left buttock.  He notes pain that radiates distally from the left buttock region.  He also has some numbness and tingling that progressively worsens with some activity.  He is adamant that he has an issue with the left hip, but x-rays were negative.  Radiographs of the lumbar spine demonstrates a flat lumbar curvature, with evidence of degenerative changes.  No obvious anterolisthesis.  I think his symptoms are most likely related to some issues in his lower back.  At this point, I recommended prednisone Dosepak, Flexeril and physical therapy.  Depending on his response to these treatments, we may have to consider obtaining an MRI.  Follow-up: Return if symptoms worsen or fail to improve.  Subjective:  Chief Complaint  Patient presents with   Hip Pain    Lt hip/glut pain that radiates down leg started 2 yrs ago.     History of Present Illness: Dennis Wells is a 56 y.o. male who has been referred by Gilford Silvius, FNP for evaluation of left lower back pain.  He states he has had pain deep within the left buttock for couple years.  He notes sudden onset of pain a couple of years ago, after jumping and landing.  He has pain which radiates distally, and occasionally has numbness and tingling in his left foot.  He states he has not been taking any pain medications.  He has not worked with physical therapy.  He is adamant that he has an issue in his left hip.   Review of Systems: No fevers or chills + numbness or tingling No chest pain No shortness of breath No bowel or bladder dysfunction No GI distress No headaches   Medical History:  Past Medical History:  Diagnosis Date   Chronic back pain     No past surgical history on file.  Family History  Problem Relation Age of Onset   Heart attack Father 83   Stroke  Mother    Social History   Tobacco Use   Smoking status: Every Day    Packs/day: 1.00    Years: 30.00    Total pack years: 30.00    Types: Cigarettes    Last attempt to quit: 03/23/2013    Years since quitting: 8.5  Substance Use Topics   Alcohol use: No    Alcohol/week: 0.0 standard drinks of alcohol   Drug use: Not Currently    Allergies  Allergen Reactions   Cephalexin Itching    Current Meds  Medication Sig   cyclobenzaprine (FLEXERIL) 10 MG tablet Take 1 tablet (10 mg total) by mouth 2 (two) times daily as needed for muscle spasms.   predniSONE (STERAPRED UNI-PAK 21 TAB) 10 MG (21) TBPK tablet 10 mg DS 12 as directed    Objective: Ht 5\' 11"  (1.803 m)   Wt 130 lb (59 kg)   BMI 18.13 kg/m   Physical Exam:  General: Alert and oriented. and No acute distress. Gait: Left sided antalgic gait.  Tenderness to palpation in the left buttock.  Negative straight leg raise.  Slightly decreased sensation of the left foot.  Good strength in bilateral lower extremities.  He has good range of motion in the left hip, which is also painless.   IMAGING: I personally ordered and reviewed the following images  Standing lumbar spine x-rays were obtained in  clinic today.  No acute injuries are noted.  Overall, the lumbar spine is straight.  Degenerative changes noted, particularly within the lower aspect of the lumbar spine.  No obvious anterolisthesis.  There are some small osteophytes forming.  Impression: Flat lumbar spine x-ray, with evidence of degenerative changes.   New Medications:  Meds ordered this encounter  Medications   predniSONE (STERAPRED UNI-PAK 21 TAB) 10 MG (21) TBPK tablet    Sig: 10 mg DS 12 as directed    Dispense:  48 tablet    Refill:  0   cyclobenzaprine (FLEXERIL) 10 MG tablet    Sig: Take 1 tablet (10 mg total) by mouth 2 (two) times daily as needed for muscle spasms.    Dispense:  40 tablet    Refill:  0      Oliver Barre,  MD  10/17/2021 10:54 PM

## 2021-10-18 ENCOUNTER — Ambulatory Visit: Payer: Medicare Other | Admitting: Family Medicine

## 2021-11-04 ENCOUNTER — Ambulatory Visit: Payer: Medicare Other | Admitting: Orthopedic Surgery

## 2021-11-18 ENCOUNTER — Ambulatory Visit: Payer: Medicare Other | Admitting: Physical Therapy

## 2021-11-20 ENCOUNTER — Other Ambulatory Visit: Payer: Self-pay

## 2021-11-20 ENCOUNTER — Ambulatory Visit: Payer: Medicare Other | Attending: Orthopedic Surgery | Admitting: Physical Therapy

## 2021-11-20 ENCOUNTER — Encounter: Payer: Self-pay | Admitting: Physical Therapy

## 2021-11-20 DIAGNOSIS — M5459 Other low back pain: Secondary | ICD-10-CM | POA: Insufficient documentation

## 2021-11-20 DIAGNOSIS — R293 Abnormal posture: Secondary | ICD-10-CM | POA: Diagnosis present

## 2021-11-20 DIAGNOSIS — M5416 Radiculopathy, lumbar region: Secondary | ICD-10-CM | POA: Insufficient documentation

## 2021-11-20 NOTE — Therapy (Signed)
OUTPATIENT PHYSICAL THERAPY THORACOLUMBAR EVALUATION   Patient Name: Dennis Wells MRN: [id number] DOB:Apr 29, 1965, 56 y.o., male Today's Date: 11/20/2021   PT End of Session - 11/20/21 1339     Visit Number 1    Number of Visits 8    Date for PT Re-Evaluation 12/18/21    PT Start Time 0107    Activity Tolerance Patient tolerated treatment well    Behavior During Therapy [facility] for tasks assessed/performed             Past Medical History:  Diagnosis Date   Chronic back pain    History reviewed. No pertinent surgical history. Patient Active Problem List   Diagnosis Date Noted   Depression, major, single episode, moderate (HCC) 10/01/2021   Chronic left hip pain 09/19/2021    REFERRING PROVIDER: Thane Edu MD  REFERRING DIAG: Radiculopathy, lumbar region.  Rationale for Evaluation and Treatment Rehabilitation  THERAPY DIAG:  Other low back pain  Abnormal posture  ONSET DATE: 2 years.  SUBJECTIVE:                                                                                                                                                                                           SUBJECTIVE STATEMENT: The patient presents to the clinic with c/o left buttock pain that he states he sustained 2 years ago after jumping off a porch.  Today, he states he is doing okay but yesterday he woke up with severe pain.  He states that certain movements, walking and driving an increase his pain to very high levels.  He further c/o pain and occasional numbness into his left LE, calf and foot.  At times he states his left LE feels heavy.    PAIN:  Are you having pain? Yes: NPRS scale: 5/10 Pain location: Left buttock Pain description: Sharp, throbbing, constant, numb. Aggravating factors: As above. Relieving factors: Nothing really.   PRECAUTIONS: None  WEIGHT BEARING RESTRICTIONS No  FALLS:  Has patient fallen in last 6 months? No    OCCUPATION: "Disabled."  PLOF:  Independent  PATIENT GOALS Get out of pain.   OBJECTIVE:   DIAGNOSTIC FINDINGS:  Flat lumbar spine x-ray, with evidence of degenerative changes.  POSTURE: rounded shoulders, forward head, and decreased lumbar lordosis(loss of lumbar lordosis).  PALPATION: Tender to palpation in left buttock region.  LUMBAR ROM:   Active lumbar flexion limited by 50% and extension to 10 degrees.   LOWER EXTREMITY MMT:   No notable LE strength deficits.  LUMBAR SPECIAL TESTS:  Normal bilateral Patellar and Achilles reflexes.  Equal leg lengths. Pain reported with a left SLR. (-)  FABER testing.    GAIT: The patient's gait is antalgic.   TODAY'S TREATMENT  HMP to low back x 10 minutes with LE elevated on wedge which was comfortable for patient.  Normal modality response following removal of modality.   PATIENT EDUCATION:  Person educated: Patient Education method: Explanation, Demonstration, and Handouts Education comprehension: verbalized understanding and returned demonstration   HOME EXERCISE PROGRAM: PROGHROAMME EXERCISE PROGRAM Created by Dennis Wells Aug 30th, 2023 View at [email or web address] using code: [email or web address] Page 1 of 1 1 Exercise SINGLE KNEE TO CHEST STRETCH - SKTC While Lying on your back, hold your knee and gently pull it up towards your chest. Repeat 3 Times Hold 3o seconds to 1 Minute Complete 1 Set Perform 3 Times a Day  ASSESSMENT:  CLINICAL IMPRESSION: The patient presents to the clinic with c/o of chronic left buttock pain that can become severe with certain movements as well as walking and driving.  He reported yesterday when he got up he was in severe pain.  He was tender in the left buttock region. His active lumbar flexion and extension is limited.  His posture is remarkable for a loss of lumbar lordosis.  His LE DTR's are normal as is his strength.  His gait is antalgic in nature.  He reported pain with a left SLR test.  Patient will benefit from  skilled physical therapy intervention to address pain and deficits.  OBJECTIVE IMPAIRMENTS Abnormal gait, decreased activity tolerance, difficulty walking, postural dysfunction, and pain.   PARTICIPATION LIMITATIONS:  Varies.  PERSONAL FACTORS Time since onset of injury/illness/exacerbation are also affecting patient's functional outcome.   REHAB POTENTIAL: Fair    CLINICAL DECISION MAKING: Stable/uncomplicated  EVALUATION COMPLEXITY: Low   GOALS:  LONG TERM GOALS: Target date: 12/18/2021  Ind with an HEP. Baseline:  Goal status: INITIAL  2.  Perform ADL's with pain not > 2-3/10. Baseline:  Goal status: INITIAL  3.  Eliminate left LE symptoms. Baseline:  Goal status: INITIAL     PLAN: PT FREQUENCY: 2x/week  PT DURATION: 4 weeks  PLANNED INTERVENTIONS: Therapeutic exercises, Therapeutic activity, Patient/Family education, Self Care, Electrical stimulation, Cryotherapy, Moist heat, Ultrasound, and Manual therapy.  PLAN FOR NEXT SESSION: S and DKTC, standing lumbar extension to restore lordosis, core exercise progression.  Modalities and STW/M as needed.   Dennis Wells, Dennis, PT 11/20/2021, 1:40 PM

## 2021-11-27 ENCOUNTER — Ambulatory Visit: Payer: Medicare Other | Attending: Orthopedic Surgery

## 2021-11-27 DIAGNOSIS — M5459 Other low back pain: Secondary | ICD-10-CM | POA: Insufficient documentation

## 2021-11-27 DIAGNOSIS — R293 Abnormal posture: Secondary | ICD-10-CM | POA: Insufficient documentation

## 2021-11-27 NOTE — Therapy (Signed)
OUTPATIENT PHYSICAL THERAPY THORACOLUMBAR EVALUATION   Patient Name: Dennis Wells MRN: [id number] DOB:1966/02/13, 56 y.o., male Today's Date: 11/27/2021   PT End of Session - 11/27/21 1310     Visit Number 2    Number of Visits 8    Date for PT Re-Evaluation 12/18/21    PT Start Time 1300    PT Stop Time 1356    PT Time Calculation (min) 56 min    Activity Tolerance Patient tolerated treatment well    Behavior During Therapy [facility] for tasks assessed/performed             Past Medical History:  Diagnosis Date   Chronic back pain    History reviewed. No pertinent surgical history. Patient Active Problem List   Diagnosis Date Noted   Depression, major, single episode, moderate (HCC) 10/01/2021   Chronic left hip pain 09/19/2021    REFERRING PROVIDER: Thane Edu MD  REFERRING DIAG: Radiculopathy, lumbar region.  Rationale for Evaluation and Treatment Rehabilitation  THERAPY DIAG:  Other low back pain  Abnormal posture  ONSET DATE: 2 years.  SUBJECTIVE:                                                                                                                                                                                           SUBJECTIVE STATEMENT: Pt arrives for today's treatment session reporting 5/10 left buttock and left calf pain.   PAIN:  Are you having pain? Yes: NPRS scale: 5/10 Pain location: Left buttock Pain description: Sharp, throbbing, constant, numb. Aggravating factors: As above. Relieving factors: Nothing really.   PRECAUTIONS: None  WEIGHT BEARING RESTRICTIONS No  FALLS:  Has patient fallen in last 6 months? No    OCCUPATION: "Disabled."  PLOF: Independent  PATIENT GOALS Get out of pain.   OBJECTIVE:   DIAGNOSTIC FINDINGS:  Flat lumbar spine x-ray, with evidence of degenerative changes.  POSTURE: rounded shoulders, forward head, and decreased lumbar lordosis(loss of lumbar lordosis).  PALPATION: Tender to  palpation in left buttock region.  LUMBAR ROM:   Active lumbar flexion limited by 50% and extension to 10 degrees.   LOWER EXTREMITY MMT:   No notable LE strength deficits.  LUMBAR SPECIAL TESTS:  Normal bilateral Patellar and Achilles reflexes.  Equal leg lengths. Pain reported with a left SLR. (-) FABER testing.    GAIT: The patient's gait is antalgic.   TODAY'S TREATMENT                                     EXERCISE LOG  Exercise Repetitions and Resistance Comments  SKC 10 reps BLE    DKC 10 reps BLE   LTR 10 reps BLE   Figure 4 Stretch 5 reps BLE x 10 sec        Blank cell = exercise not performed today   Manual Therapy Soft Tissue Mobilization: left buttock, STW/M performed to left buttock and piriformis to decrease pain and tone with pt in right side-lying  Modalities  Date:  Unattended Estim: Lumbar, 80-150 Hz, 15 mins, Pain and Tone Hot Pack: Lumbar, 15 mins, Pain and Tone             PATIENT EDUCATION:  Person educated: Patient Education method: Explanation, Demonstration, and Handouts Education comprehension: verbalized understanding and returned demonstration   HOME EXERCISE PROGRAM: PROGHROAMME EXERCISE PROGRAM Created by [place] Applegate Aug 30th, 2023 View at [email or web address] using code: [email or web address] Page 1 of 1 1 Exercise SINGLE KNEE TO CHEST STRETCH - SKTC While Lying on your back, hold your knee and gently pull it up towards your chest. Repeat 3 Times Hold 3o seconds to 1 Minute Complete 1 Set Perform 3 Times a Day  ASSESSMENT:  CLINICAL IMPRESSION: Pt arrives for today's treatment session reporting 5/10 left buttock and calf pain.  Pt very skeptical of how much physical therapy is going to help with his LLE pain.  Pt instructed in supine stretches to perform at home.  Pt declined printed version of HEP.  STW/M performed to left glute region as well as left piriformis to decrease pain and tone.  Normal responses to estim and mH  noted upon removal.  Pt reported 5/10 pain at completion of today's treatment session.   OBJECTIVE IMPAIRMENTS Abnormal gait, decreased activity tolerance, difficulty walking, postural dysfunction, and pain.   PARTICIPATION LIMITATIONS:  Varies.  PERSONAL FACTORS Time since onset of injury/illness/exacerbation are also affecting patient's functional outcome.   REHAB POTENTIAL: Fair    CLINICAL DECISION MAKING: Stable/uncomplicated  EVALUATION COMPLEXITY: Low   GOALS:  LONG TERM GOALS: Target date: 12/25/2021  Ind with an HEP. Baseline:  Goal status: INITIAL  2.  Perform ADL's with pain not > 2-3/10. Baseline:  Goal status: INITIAL  3.  Eliminate left LE symptoms. Baseline:  Goal status: INITIAL     PLAN: PT FREQUENCY: 2x/week  PT DURATION: 4 weeks  PLANNED INTERVENTIONS: Therapeutic exercises, Therapeutic activity, Patient/Family education, Self Care, Electrical stimulation, Cryotherapy, Moist heat, Ultrasound, and Manual therapy.  PLAN FOR NEXT SESSION: S and DKTC, standing lumbar extension to restore lordosis, core exercise progression.  Modalities and STW/M as needed.   Newman Pies, PTA 11/27/2021, 2:04 PM

## 2021-12-03 ENCOUNTER — Ambulatory Visit: Payer: Medicare Other

## 2021-12-03 DIAGNOSIS — M5459 Other low back pain: Secondary | ICD-10-CM | POA: Diagnosis not present

## 2021-12-03 DIAGNOSIS — R293 Abnormal posture: Secondary | ICD-10-CM

## 2021-12-03 NOTE — Therapy (Signed)
OUTPATIENT PHYSICAL THERAPY THORACOLUMBAR EVALUATION   Patient Name: Dennis Wells MRN: [id number] DOB:Aug 13, 1965, 56 y.o., male Today's Date: 12/03/2021   PT End of Session - 12/03/21 1301     Visit Number 3    Number of Visits 8    Date for PT Re-Evaluation 12/18/21    PT Start Time 1300    PT Stop Time 1345    PT Time Calculation (min) 45 min    Activity Tolerance Patient tolerated treatment well    Behavior During Therapy [facility] for tasks assessed/performed              Past Medical History:  Diagnosis Date   Chronic back pain    History reviewed. No pertinent surgical history. Patient Active Problem List   Diagnosis Date Noted   Depression, major, single episode, moderate (HCC) 10/01/2021   Chronic left hip pain 09/19/2021    REFERRING PROVIDER: Thane Edu MD  REFERRING DIAG: Radiculopathy, lumbar region.  Rationale for Evaluation and Treatment Rehabilitation  THERAPY DIAG:  Other low back pain  Abnormal posture  ONSET DATE: 2 years.  SUBJECTIVE:                                                                                                                                                                                           SUBJECTIVE STATEMENT: Patient reports that his leg is tingling right now when standing. He tried doing his HEP at home, but he was unable to find a comfortable position.   PAIN:  Are you having pain? Yes: NPRS scale: 8/10 Pain location: Left buttock Pain description: Sharp, throbbing, constant, numb. Aggravating factors: As above. Relieving factors: Nothing really.   PRECAUTIONS: None  WEIGHT BEARING RESTRICTIONS No  FALLS:  Has patient fallen in last 6 months? No  OCCUPATION: "Disabled."  PLOF: Independent  PATIENT GOALS Get out of pain.   OBJECTIVE:   DIAGNOSTIC FINDINGS:  Flat lumbar spine x-ray, with evidence of degenerative changes.  POSTURE: rounded shoulders, forward head, and decreased lumbar  lordosis(loss of lumbar lordosis).  PALPATION: Tender to palpation in left buttock region.  LUMBAR ROM:   Active lumbar flexion limited by 50% and extension to 10 degrees.   LOWER EXTREMITY MMT:   No notable LE strength deficits.  LUMBAR SPECIAL TESTS:  Normal bilateral Patellar and Achilles reflexes.  Equal leg lengths. Pain reported with a left SLR. (-) FABER testing.    GAIT: The patient's gait is antalgic.   TODAY'S TREATMENT  9/12 EXERCISE LOG  Exercise Repetitions and Resistance Comments  Standing HS stretch 4 x 30 seconds each   Standing HS curl 30 reps   Standing heel/toe raises 2 minutes   Standing marching 2 minutes   Standing hip ABD 30 reps each    Blank cell = exercise not performed today  Modalities  Date:  Unattended Estim: left lumbar paraspinals, pre mod 80-150 Hz, 15 mins, Pain Hot Pack: Lumbar, 15 mins, Pain                                   9/6 EXERCISE LOG  Exercise Repetitions and Resistance Comments  SKC 10 reps BLE    DKC 10 reps BLE   LTR 10 reps BLE   Figure 4 Stretch 5 reps BLE x 10 sec        Blank cell = exercise not performed today   Manual Therapy Soft Tissue Mobilization: left buttock, STW/M performed to left buttock and piriformis to decrease pain and tone with pt in right side-lying  Modalities  Date:  Unattended Estim: Lumbar, 80-150 Hz, 15 mins, Pain and Tone Hot Pack: Lumbar, 15 mins, Pain and Tone  PATIENT EDUCATION:  Person educated: Patient Education method: Explanation, Demonstration, and Handouts Education comprehension: verbalized understanding and returned demonstration   HOME EXERCISE PROGRAM: PROGHROAMME EXERCISE PROGRAM Created by [place] Applegate Aug 30th, 2023 View at [email or web address] using code: [email or web address] Page 1 of 1 1 Exercise SINGLE KNEE TO CHEST STRETCH - SKTC While Lying on your back, hold your knee and gently pull it up towards your chest. Repeat 3  Times Hold 3o seconds to 1 Minute Complete 1 Set Perform 3 Times a Day  ASSESSMENT:  CLINICAL IMPRESSION: Patient was introduced to multiple new standing interventions for improved lumbar and lower extremity mobility and strength. He required minimal cueing with each of these new interventions for proper exercise performance and pacing to avoid aggravating his familiar symptoms. He reported no significant pain or discomfort with any of today's interventions. He reported feeling better upon the conclusion of treatment. He continues to require skilled physical therapy to address his remaining impairments to maximize his functional mobility.  OBJECTIVE IMPAIRMENTS Abnormal gait, decreased activity tolerance, difficulty walking, postural dysfunction, and pain.   PARTICIPATION LIMITATIONS:  Varies.  PERSONAL FACTORS Time since onset of injury/illness/exacerbation are also affecting patient's functional outcome.   REHAB POTENTIAL: Fair    CLINICAL DECISION MAKING: Stable/uncomplicated  EVALUATION COMPLEXITY: Low   GOALS:  LONG TERM GOALS: Target date: 12/31/2021  Ind with an HEP. Baseline:  Goal status: INITIAL  2.  Perform ADL's with pain not > 2-3/10. Baseline:  Goal status: INITIAL  3.  Eliminate left LE symptoms. Baseline:  Goal status: INITIAL     PLAN: PT FREQUENCY: 2x/week  PT DURATION: 4 weeks  PLANNED INTERVENTIONS: Therapeutic exercises, Therapeutic activity, Patient/Family education, Self Care, Electrical stimulation, Cryotherapy, Moist heat, Ultrasound, and Manual therapy.  PLAN FOR NEXT SESSION: S and DKTC, standing lumbar extension to restore lordosis, core exercise progression.  Modalities and STW/M as needed.   Granville Lewis, PT 12/03/2021, 1:48 PM

## 2021-12-05 ENCOUNTER — Ambulatory Visit: Payer: Medicare Other | Admitting: Physical Therapy

## 2021-12-05 DIAGNOSIS — M5459 Other low back pain: Secondary | ICD-10-CM

## 2021-12-05 DIAGNOSIS — R293 Abnormal posture: Secondary | ICD-10-CM

## 2021-12-05 NOTE — Therapy (Signed)
OUTPATIENT PHYSICAL THERAPY THORACOLUMBAR EVALUATION   Patient Name: Dennis Wells MRN: [id number] DOB:1965/07/02, 56 y.o., male Today's Date: 12/05/2021   PT End of Session - 12/05/21 1344     Visit Number 4    Number of Visits 8    Date for PT Re-Evaluation 12/18/21    PT Start Time 0102    PT Stop Time 0141    PT Time Calculation (min) 39 min    Activity Tolerance Patient tolerated treatment well    Behavior During Therapy [facility] for tasks assessed/performed               Past Medical History:  Diagnosis Date   Chronic back pain    No past surgical history on file. Patient Active Problem List   Diagnosis Date Noted   Depression, major, single episode, moderate (HCC) 10/01/2021   Chronic left hip pain 09/19/2021    REFERRING PROVIDER: Thane Edu MD  REFERRING DIAG: Radiculopathy, lumbar region.  Rationale for Evaluation and Treatment Rehabilitation  THERAPY DIAG:  Other low back pain  Abnormal posture  ONSET DATE: 2 years.  SUBJECTIVE:                                                                                                                                                                                           SUBJECTIVE STATEMENT: About the same.  PAIN:  Are you having pain? Yes: NPRS scale: 8/10 Pain location: Left buttock Pain description: Sharp, throbbing, constant, numb. Aggravating factors: As above. Relieving factors: Nothing really.   PRECAUTIONS: None  WEIGHT BEARING RESTRICTIONS No  FALLS:  Has patient fallen in last 6 months? No  OCCUPATION: "Disabled."  PLOF: Independent  PATIENT GOALS Get out of pain.   OBJECTIVE:   DIAGNOSTIC FINDINGS:  Flat lumbar spine x-ray, with evidence of degenerative changes.  POSTURE: rounded shoulders, forward head, and decreased lumbar lordosis(loss of lumbar lordosis).  PALPATION: Tender to palpation in left buttock region.  LUMBAR ROM:   Active lumbar flexion limited by 50%  and extension to 10 degrees.   LOWER EXTREMITY MMT:   No notable LE strength deficits.  LUMBAR SPECIAL TESTS:  Normal bilateral Patellar and Achilles reflexes.  Equal leg lengths. Pain reported with a left SLR. (-) FABER testing.    GAIT: The patient's gait is antalgic.   TODAY'S TREATMENT                                    9/14 EXERCISE LOG  Exercise Repetitions and Resistance Comments  Recumbent bike Level 2 x  8 minutes.                   Manual:  1-1 stretching into left hip flexion (2 minutes) and Piriformis stretch x 2 minutes f/b STW/M with ischemic release technique x 5 minutes f/b HMP and IFC at 18-150 Hz on 40% scan x 15 minutes.     ASSESSMENT:  CLINICAL IMPRESSION: Patient reports it feels like he is sitting on a "lego".  CC continues to be pain in his left buttock region.  Patient tolerated treatment without complaint with normal modality response following removal of modality. OBJECTIVE IMPAIRMENTS Abnormal gait, decreased activity tolerance, difficulty walking, postural dysfunction, and pain.   PARTICIPATION LIMITATIONS:  Varies.  PERSONAL FACTORS Time since onset of injury/illness/exacerbation are also affecting patient's functional outcome.   REHAB POTENTIAL: Fair    CLINICAL DECISION MAKING: Stable/uncomplicated  EVALUATION COMPLEXITY: Low   GOALS:  LONG TERM GOALS: Target date: 12/31/2021  Ind with an HEP. Baseline:  Goal status: INITIAL  2.  Perform ADL's with pain not > 2-3/10. Baseline:  Goal status: INITIAL  3.  Eliminate left LE symptoms. Baseline:  Goal status: INITIAL     PLAN: PT FREQUENCY: 2x/week  PT DURATION: 4 weeks  PLANNED INTERVENTIONS: Therapeutic exercises, Therapeutic activity, Patient/Family education, Self Care, Electrical stimulation, Cryotherapy, Moist heat, Ultrasound, and Manual therapy.  PLAN FOR NEXT SESSION: S and DKTC, standing lumbar extension to restore lordosis, core exercise progression.   Modalities and STW/M as needed.   Kyndall Amero, [place], PT 12/05/2021, 2:20 PM

## 2021-12-05 NOTE — Therapy (Signed)
OUTPATIENT PHYSICAL THERAPY THORACOLUMBAR EVALUATION   Patient Name: Dennis Wells MRN: [id number] DOB:[birth year], 56 y.o., male Today's Date: 12/05/2021      Past Medical History:  Diagnosis Date   Chronic back pain    No past surgical history on file. Patient Active Problem List   Diagnosis Date Noted   Depression, major, single episode, moderate (HCC) 10/01/2021   Chronic left hip pain 09/19/2021    REFERRING PROVIDER: Thane Edu MD  REFERRING DIAG: Radiculopathy, lumbar region.  Rationale for Evaluation and Treatment Rehabilitation  THERAPY DIAG:  Other low back pain  Abnormal posture  ONSET DATE: 2 years.  SUBJECTIVE:                                                                                                                                                                                           SUBJECTIVE STATEMENT: Patient reports that his leg is tingling right now when standing. He tried doing his HEP at home, but he was unable to find a comfortable position.   PAIN:  Are you having pain? Yes: NPRS scale: 8/10 Pain location: Left buttock Pain description: Sharp, throbbing, constant, numb. Aggravating factors: As above. Relieving factors: Nothing really.   PRECAUTIONS: None  WEIGHT BEARING RESTRICTIONS No  FALLS:  Has patient fallen in last 6 months? No  OCCUPATION: "Disabled."  PLOF: Independent  PATIENT GOALS Get out of pain.   OBJECTIVE:   DIAGNOSTIC FINDINGS:  Flat lumbar spine x-ray, with evidence of degenerative changes.  POSTURE: rounded shoulders, forward head, and decreased lumbar lordosis(loss of lumbar lordosis).  PALPATION: Tender to palpation in left buttock region.  LUMBAR ROM:   Active lumbar flexion limited by 50% and extension to 10 degrees.   LOWER EXTREMITY MMT:   No notable LE strength deficits.  LUMBAR SPECIAL TESTS:  Normal bilateral Patellar and Achilles reflexes.  Equal leg lengths. Pain  reported with a left SLR. (-) FABER testing.    GAIT: The patient's gait is antalgic.   TODAY'S TREATMENT                                    9/12 EXERCISE LOG  Exercise Repetitions and Resistance Comments  Standing HS stretch 4 x 30 seconds each   Standing HS curl 30 reps   Standing heel/toe raises 2 minutes   Standing marching 2 minutes   Standing hip ABD 30 reps each    Blank cell = exercise not performed today  Modalities  Date:  Unattended Estim: left lumbar paraspinals, pre mod 80-150 Hz, 15 mins,  Pain Hot Pack: Lumbar, 15 mins, Pain                                   9/6 EXERCISE LOG  Exercise Repetitions and Resistance Comments  SKC 10 reps BLE    DKC 10 reps BLE   LTR 10 reps BLE   Figure 4 Stretch 5 reps BLE x 10 sec        Blank cell = exercise not performed today   Manual Therapy Soft Tissue Mobilization: left buttock, STW/M performed to left buttock and piriformis to decrease pain and tone with pt in right side-lying  Modalities  Date:  Unattended Estim: Lumbar, 80-150 Hz, 15 mins, Pain and Tone Hot Pack: Lumbar, 15 mins, Pain and Tone  PATIENT EDUCATION:  Person educated: Patient Education method: Explanation, Demonstration, and Handouts Education comprehension: verbalized understanding and returned demonstration   HOME EXERCISE PROGRAM: PROGHROAMME EXERCISE PROGRAM Created by [place] Kristina Mcnorton Aug 30th, 2023 View at [email or web address] using code: [email or web address] Page 1 of 1 1 Exercise SINGLE KNEE TO CHEST STRETCH - SKTC While Lying on your back, hold your knee and gently pull it up towards your chest. Repeat 3 Times Hold 3o seconds to 1 Minute Complete 1 Set Perform 3 Times a Day  ASSESSMENT:  CLINICAL IMPRESSION: Patient was introduced to multiple new standing interventions for improved lumbar and lower extremity mobility and strength. He required minimal cueing with each of these new interventions for proper exercise performance and pacing to  avoid aggravating his familiar symptoms. He reported no significant pain or discomfort with any of today's interventions. He reported feeling better upon the conclusion of treatment. He continues to require skilled physical therapy to address his remaining impairments to maximize his functional mobility.  OBJECTIVE IMPAIRMENTS Abnormal gait, decreased activity tolerance, difficulty walking, postural dysfunction, and pain.   PARTICIPATION LIMITATIONS:  Varies.  PERSONAL FACTORS Time since onset of injury/illness/exacerbation are also affecting patient's functional outcome.   REHAB POTENTIAL: Fair    CLINICAL DECISION MAKING: Stable/uncomplicated  EVALUATION COMPLEXITY: Low   GOALS:  LONG TERM GOALS: Target date: 12/31/2021  Ind with an HEP. Baseline:  Goal status: INITIAL  2.  Perform ADL's with pain not > 2-3/10. Baseline:  Goal status: INITIAL  3.  Eliminate left LE symptoms. Baseline:  Goal status: INITIAL     PLAN: PT FREQUENCY: 2x/week  PT DURATION: 4 weeks  PLANNED INTERVENTIONS: Therapeutic exercises, Therapeutic activity, Patient/Family education, Self Care, Electrical stimulation, Cryotherapy, Moist heat, Ultrasound, and Manual therapy.  PLAN FOR NEXT SESSION: S and DKTC, standing lumbar extension to restore lordosis, core exercise progression.  Modalities and STW/M as needed.   Lyllian Gause, [place], PT 12/05/2021, 1:31 PM

## 2021-12-09 ENCOUNTER — Ambulatory Visit: Payer: Medicare Other | Admitting: Family Medicine

## 2021-12-10 ENCOUNTER — Encounter: Payer: Medicare Other | Admitting: *Deleted

## 2021-12-12 ENCOUNTER — Encounter: Payer: Medicare Other | Admitting: *Deleted

## 2022-01-01 ENCOUNTER — Encounter: Payer: Self-pay | Admitting: Family Medicine

## 2022-01-01 ENCOUNTER — Ambulatory Visit: Payer: Medicare Other | Admitting: Family Medicine

## 2022-02-05 ENCOUNTER — Telehealth: Payer: Self-pay | Admitting: Family Medicine

## 2022-02-05 NOTE — Telephone Encounter (Signed)
  Left message for patient to call back and schedule Medicare Annual Wellness Visit (AWV) to be completed by video or phone.  No hx of AWV eligible for AWVI per [facility] as of  08/23/2010  Please schedule at anytime with [facility] Nurse Health Advisor   45  Minutes appointment   Any questions, please call me at [phone]

## 2022-05-14 ENCOUNTER — Telehealth: Payer: Self-pay | Admitting: Family Medicine

## 2022-05-14 NOTE — Telephone Encounter (Signed)
Contacted Dennis Wells to schedule their annual wellness visit. Appointment made for 05/16/2022 .  Thank you,  Colletta Maryland,  [place] Program Direct Dial ??[phone]

## 2022-05-16 ENCOUNTER — Ambulatory Visit: Payer: Medicare Other

## 2022-05-19 ENCOUNTER — Telehealth: Payer: Self-pay | Admitting: Family Medicine

## 2022-05-19 NOTE — Telephone Encounter (Signed)
Contacted Ramaj A Staat to schedule their annual wellness visit.  Appointment made for 06/04/2022.  Thank you,  Colletta Maryland,  [place] Program Direct Dial ??[phone]

## 2022-06-03 NOTE — Progress Notes (Deleted)
Subjective:   Dennis Wells is a 57 y.o. male who presents for an Initial Medicare Annual Wellness Visit.  Review of Systems    ***       Objective:    There were no vitals filed for this visit. There is no height or weight on file to calculate BMI.     11/20/2021    1:39 PM 08/25/2015    8:49 PM 08/05/2015    6:28 PM 08/02/2015    2:56 PM  Advanced Directives  Does Patient Have a Medical Advance Directive? No No No No  Would patient like information on creating a medical advance directive?  No - patient declined information  No - patient declined information    Current Medications (verified) Outpatient Encounter Medications as of 06/04/2022  Medication Sig   clindamycin (CLEOCIN) 300 MG capsule Take 300 mg by mouth 4 (four) times daily.   cyclobenzaprine (FLEXERIL) 10 MG tablet Take 1 tablet (10 mg total) by mouth 2 (two) times daily as needed for muscle spasms.   predniSONE (STERAPRED UNI-PAK 21 TAB) 10 MG (21) TBPK tablet 10 mg DS 12 as directed   No facility-administered encounter medications on file as of 06/04/2022.    Allergies (verified) Cephalexin   History: Past Medical History:  Diagnosis Date   Chronic back pain    No past surgical history on file. Family History  Problem Relation Age of Onset   Heart attack Father 18   Stroke Mother    Social History   Socioeconomic History   Marital status: Legally Separated    Spouse name: Not on file   Number of children: Not on file   Years of education: Not on file   Highest education level: Not on file  Occupational History   Not on file  Tobacco Use   Smoking status: Every Day    Packs/day: 1.00    Years: 30.00    Total pack years: 30.00    Types: Cigarettes    Last attempt to quit: 03/23/2013    Years since quitting: 9.2   Smokeless tobacco: Not on file  Substance and Sexual Activity   Alcohol use: No    Alcohol/week: 0.0 standard drinks of alcohol   Drug use: Not Currently   Sexual  activity: Not on file  Other Topics Concern   Not on file  Social History Narrative   Not on file   Social Determinants of Health   Financial Resource Strain: Not on file  Food Insecurity: Not on file  Transportation Needs: Not on file  Physical Activity: Not on file  Stress: Not on file  Social Connections: Not on file    Tobacco Counseling Ready to quit: Not Answered Counseling given: Not Answered   Clinical Intake:                 Diabetic?No          Activities of Daily Living     No data to display          Patient Care Team: Rakes, Connye Burkitt, FNP as PCP - General (Family Medicine)  Indicate any recent Medical Services you may have received from other than [facility] providers in the past year (date may be approximate).     Assessment:   This is a routine wellness examination for Dennis Wells.  Hearing/Vision screen No results found.  Dietary issues and exercise activities discussed:     Goals Addressed   None    Depression Screen  10/01/2021   12:24 PM 09/19/2021    2:07 PM  PHQ 2/9 Scores  PHQ - 2 Score 6 0  PHQ- 9 Score 24     Fall Risk    10/01/2021   12:25 PM 09/19/2021    2:07 PM  Fall Risk   Falls in the past year? 1 0  Number falls in past yr: 1   Injury with Fall? 0     FALL RISK PREVENTION PERTAINING TO THE HOME:  Any stairs in or around the home? {YES/NO:21197} If so, are there any without handrails? {YES/NO:21197} Home free of loose throw rugs in walkways, pet beds, electrical cords, etc? {YES/NO:21197} Adequate lighting in your home to reduce risk of falls? {YES/NO:21197}  ASSISTIVE DEVICES UTILIZED TO PREVENT FALLS:  Life alert? {YES/NO:21197} Use of a cane, walker or w/c? {YES/NO:21197} Grab bars in the bathroom? {YES/NO:21197} Shower chair or bench in shower? {YES/NO:21197} Elevated toilet seat or a handicapped toilet? {YES/NO:21197}  TIMED UP AND GO:  Was the test performed? No . Telephonic visit    Cognitive Function:        Immunizations Immunization History  Administered Date(s) Administered   Tdap 08/02/2015    TDAP status: Up to date  {Flu Vaccine status:2101806}  {Pneumococcal vaccine status:2101807}  {Covid-19 vaccine status:2101808}  Qualifies for Shingles Vaccine? {YES/NO:21197}  Zostavax completed {YES/NO:21197}  {Shingrix Completed?:2101804}  Screening Tests Health Maintenance  Topic Date Due   Medicare Annual Wellness (AWV)  Never done   COVID-19 Vaccine (1) Never done   Lung Cancer Screening  Never done   Zoster Vaccines- Shingrix (1 of 2) Never done   INFLUENZA VACCINE  Never done   COLONOSCOPY (Pts 45-68yr Insurance coverage will need to be confirmed)  09/20/2022 (Originally 10/11/2010)   Hepatitis C Screening  09/20/2022 (Originally 10/11/1983)   HIV Screening  09/20/2022 (Originally 10/10/1980)   DTaP/Tdap/Td (2 - Td or Tdap) 08/01/2025   HPV VACCINES  Aged Out    Health Maintenance  Health Maintenance Due  Topic Date Due   Medicare Annual Wellness (AWV)  Never done   COVID-19 Vaccine (1) Never done   Lung Cancer Screening  Never done   Zoster Vaccines- Shingrix (1 of 2) Never done   INFLUENZA VACCINE  Never done    {Colorectal cancer screening:2101809}  Lung Cancer Screening: (Low Dose CT Chest recommended if Age 57-80years, 30 pack-year currently smoking OR have quit w/in 15years.) {DOES NOT does:27190::"does not"} qualify.   Lung Cancer Screening Referral: ***  Additional Screening:  Hepatitis C Screening: {DOES NOT does:27190::"does not"} qualify; Completed ***  Vision Screening: Recommended annual ophthalmology exams for early detection of glaucoma and other disorders of the eye. Is the patient up to date with their annual eye exam?  {YES/NO:21197} Who is the provider or what is the name of the office in which the patient attends annual eye exams? *** If pt is not established with a provider, would they like to be referred to  a provider to establish care? {YES/NO:21197}.   Dental Screening: Recommended annual dental exams for proper oral hygiene  Community Resource Referral / Chronic Care Management: CRR required this visit?  {YES/NO:21197}  CCM required this visit?  {YES/NO:21197}     Plan:     I have personally reviewed and noted the following in the patient's chart:   Medical and social history Use of alcohol, tobacco or illicit drugs  Current medications and supplements including opioid prescriptions. {Opioid Prescriptions:[phone]} Functional ability and status Nutritional status Physical  activity Advanced directives List of other physicians Hospitalizations, surgeries, and ER visits in previous 12 months Vitals Screenings to include cognitive, depression, and falls Referrals and appointments  In addition, I have reviewed and discussed with patient certain preventive protocols, quality metrics, and best practice recommendations. A written personalized care plan for preventive services as well as general preventive health recommendations were provided to patient.     Vanetta Mulders, [place]   075-GRM   Due to this being a virtual visit, the after visit summary with patients personalized plan was offered to patient via mail or my-chart. ***Patient declined at this time./ Patient would like to access on my-chart/ per request, patient was mailed a copy of AVS./ Patient preferred to pick up at office at next visit  Nurse Notes: ***

## 2022-06-04 ENCOUNTER — Ambulatory Visit: Payer: Medicare Other

## 2022-06-25 ENCOUNTER — Telehealth: Payer: Self-pay | Admitting: Family Medicine

## 2022-06-25 NOTE — Telephone Encounter (Signed)
Called patient to schedule Medicare Annual Wellness Visit (AWV). Left message for patient to call back and schedule Medicare Annual Wellness Visit (AWV).  Last date of AWV: due 08/23/2010 awvi per [facility]  Please schedule an appointment at any time with either Mickel Baas or [place], NHA's.  If any questions, please contact me at [phone].  Thank you,  Colletta Maryland,  [place] Program Direct Dial ??[phone]

## 2022-07-17 ENCOUNTER — Telehealth: Payer: Self-pay | Admitting: Family Medicine

## 2022-07-17 NOTE — Telephone Encounter (Signed)
Called patient to schedule Medicare Annual Wellness Visit (AWV). Left message for patient to call back and schedule Medicare Annual Wellness Visit (AWV).    Please schedule an appointment at any time with Vernona Rieger, [facility]. .  If any questions, please contact me at [phone].  Thank you,  Judeth Cornfield,  AMB Clinical Support [facility] AWV Program Direct Dial ??[phone]

## 2022-07-23 ENCOUNTER — Telehealth: Payer: Self-pay | Admitting: Family Medicine

## 2022-07-23 NOTE — Telephone Encounter (Signed)
Called patient to schedule Medicare Annual Wellness Visit (AWV). Left message for patient to call back and schedule Medicare Annual Wellness Visit (AWV).  Last date of AWV:  due 08/23/2010 awvi per [facility]  Please schedule an appointment at any time with Vernona Rieger, [facility]. .  If any questions, please contact me at [phone].  Thank you,  Judeth Cornfield,  AMB Clinical Support [facility] AWV Program Direct Dial ??[phone]

## 2022-08-05 ENCOUNTER — Telehealth: Payer: Self-pay | Admitting: Family Medicine

## 2022-08-05 NOTE — Telephone Encounter (Signed)
Called patient to schedule Medicare Annual Wellness Visit (AWV). Left message for patient to call back and schedule Medicare Annual Wellness Visit (AWV).  Last date of AWV:  due 08/23/2010 awvi per [facility]  Please schedule an appointment at any time with Laura, NHA. .  If any questions, please contact me at [phone].  Thank you,  Stephanie,  AMB Clinical Support [facility] AWV Program Direct Dial ??[phone]
